# Patient Record
Sex: Female | Born: 1990 | Race: White | Hispanic: No | Marital: Single | Smoking: Never smoker
Health system: Southern US, Community
[De-identification: ages and names within clinical notes are randomized; demographics above are authoritative.]

## PROBLEM LIST (undated history)

## (undated) DIAGNOSIS — F32A Depression, unspecified: Secondary | ICD-10-CM

## (undated) DIAGNOSIS — T7840XA Allergy, unspecified, initial encounter: Secondary | ICD-10-CM

## (undated) DIAGNOSIS — F329 Major depressive disorder, single episode, unspecified: Secondary | ICD-10-CM

## (undated) HISTORY — PX: TONSILLECTOMY AND ADENOIDECTOMY: SUR1326

## (undated) HISTORY — DX: Major depressive disorder, single episode, unspecified: F32.9

## (undated) HISTORY — DX: Depression, unspecified: F32.A

## (undated) HISTORY — DX: Allergy, unspecified, initial encounter: T78.40XA

---
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
## 2013-03-03 ENCOUNTER — Emergency Department: Payer: Self-pay | Admitting: Emergency Medicine

## 2015-10-01 ENCOUNTER — Encounter: Payer: Self-pay | Admitting: Family Medicine

## 2016-01-09 DIAGNOSIS — F411 Generalized anxiety disorder: Secondary | ICD-10-CM | POA: Diagnosis not present

## 2016-01-09 DIAGNOSIS — F845 Asperger's syndrome: Secondary | ICD-10-CM | POA: Diagnosis not present

## 2016-01-09 DIAGNOSIS — F4321 Adjustment disorder with depressed mood: Secondary | ICD-10-CM | POA: Diagnosis not present

## 2016-02-06 DIAGNOSIS — F411 Generalized anxiety disorder: Secondary | ICD-10-CM | POA: Diagnosis not present

## 2016-02-06 DIAGNOSIS — F4321 Adjustment disorder with depressed mood: Secondary | ICD-10-CM | POA: Diagnosis not present

## 2016-02-06 DIAGNOSIS — F845 Asperger's syndrome: Secondary | ICD-10-CM | POA: Diagnosis not present

## 2016-03-11 DIAGNOSIS — D2272 Melanocytic nevi of left lower limb, including hip: Secondary | ICD-10-CM | POA: Diagnosis not present

## 2016-03-11 DIAGNOSIS — D225 Melanocytic nevi of trunk: Secondary | ICD-10-CM | POA: Diagnosis not present

## 2016-04-20 DIAGNOSIS — R05 Cough: Secondary | ICD-10-CM | POA: Diagnosis not present

## 2016-04-20 DIAGNOSIS — J069 Acute upper respiratory infection, unspecified: Secondary | ICD-10-CM | POA: Diagnosis not present

## 2016-05-01 DIAGNOSIS — F411 Generalized anxiety disorder: Secondary | ICD-10-CM | POA: Diagnosis not present

## 2016-05-01 DIAGNOSIS — F4321 Adjustment disorder with depressed mood: Secondary | ICD-10-CM | POA: Diagnosis not present

## 2016-05-01 DIAGNOSIS — F845 Asperger's syndrome: Secondary | ICD-10-CM | POA: Diagnosis not present

## 2016-07-02 DIAGNOSIS — H52223 Regular astigmatism, bilateral: Secondary | ICD-10-CM | POA: Diagnosis not present

## 2016-07-02 DIAGNOSIS — H5203 Hypermetropia, bilateral: Secondary | ICD-10-CM | POA: Diagnosis not present

## 2017-11-15 ENCOUNTER — Ambulatory Visit: Payer: BLUE CROSS/BLUE SHIELD | Admitting: Family Medicine

## 2017-11-15 ENCOUNTER — Telehealth: Payer: Self-pay | Admitting: Family Medicine

## 2017-11-15 ENCOUNTER — Encounter: Payer: Self-pay | Admitting: Family Medicine

## 2017-11-15 VITALS — BP 120/70 | HR 100 | Temp 98.4°F | Resp 16 | Ht 61.0 in | Wt 141.5 lb

## 2017-11-15 DIAGNOSIS — B079 Viral wart, unspecified: Secondary | ICD-10-CM

## 2017-11-15 DIAGNOSIS — Z13220 Encounter for screening for lipoid disorders: Secondary | ICD-10-CM | POA: Diagnosis not present

## 2017-11-15 DIAGNOSIS — J302 Other seasonal allergic rhinitis: Secondary | ICD-10-CM | POA: Diagnosis not present

## 2017-11-15 DIAGNOSIS — F840 Autistic disorder: Secondary | ICD-10-CM

## 2017-11-15 DIAGNOSIS — Z114 Encounter for screening for human immunodeficiency virus [HIV]: Secondary | ICD-10-CM | POA: Diagnosis not present

## 2017-11-15 DIAGNOSIS — Z131 Encounter for screening for diabetes mellitus: Secondary | ICD-10-CM | POA: Diagnosis not present

## 2017-11-15 DIAGNOSIS — F411 Generalized anxiety disorder: Secondary | ICD-10-CM | POA: Insufficient documentation

## 2017-11-15 DIAGNOSIS — Z7689 Persons encountering health services in other specified circumstances: Secondary | ICD-10-CM | POA: Diagnosis not present

## 2017-11-15 DIAGNOSIS — Z0000 Encounter for general adult medical examination without abnormal findings: Secondary | ICD-10-CM | POA: Diagnosis not present

## 2017-11-15 DIAGNOSIS — Z833 Family history of diabetes mellitus: Secondary | ICD-10-CM

## 2017-11-15 DIAGNOSIS — F339 Major depressive disorder, recurrent, unspecified: Secondary | ICD-10-CM | POA: Diagnosis not present

## 2017-11-15 DIAGNOSIS — F4321 Adjustment disorder with depressed mood: Secondary | ICD-10-CM

## 2017-11-15 DIAGNOSIS — E663 Overweight: Secondary | ICD-10-CM | POA: Diagnosis not present

## 2017-11-15 DIAGNOSIS — Z8639 Personal history of other endocrine, nutritional and metabolic disease: Secondary | ICD-10-CM

## 2017-11-15 DIAGNOSIS — Z23 Encounter for immunization: Secondary | ICD-10-CM | POA: Diagnosis not present

## 2017-11-15 DIAGNOSIS — T7840XA Allergy, unspecified, initial encounter: Secondary | ICD-10-CM | POA: Insufficient documentation

## 2017-11-15 DIAGNOSIS — F845 Asperger's syndrome: Secondary | ICD-10-CM | POA: Insufficient documentation

## 2017-11-15 NOTE — Telephone Encounter (Signed)
Documentation reviewed, diagnoses updated in patient's problem list to reflect Dr. Waylan Boga notes from June 2018.

## 2017-11-15 NOTE — Telephone Encounter (Signed)
Please request records from Dr. Nicolasa Ducking regarding depression and autism diagnoses. Thanks!

## 2017-11-15 NOTE — Patient Instructions (Signed)
Preventive Care 18-39 Years, Female Preventive care refers to lifestyle choices and visits with your health care provider that can promote health and wellness. What does preventive care include?  A yearly physical exam. This is also called an annual well check.  Dental exams once or twice a year.  Routine eye exams. Ask your health care provider how often you should have your eyes checked.  Personal lifestyle choices, including: ? Daily care of your teeth and gums. ? Regular physical activity. ? Eating a healthy diet. ? Avoiding tobacco and drug use. ? Limiting alcohol use. ? Practicing safe sex. ? Taking vitamin and mineral supplements as recommended by your health care provider. What happens during an annual well check? The services and screenings done by your health care provider during your annual well check will depend on your age, overall health, lifestyle risk factors, and family history of disease. Counseling Your health care provider may ask you questions about your:  Alcohol use.  Tobacco use.  Drug use.  Emotional well-being.  Home and relationship well-being.  Sexual activity.  Eating habits.  Work and work Statistician.  Method of birth control.  Menstrual cycle.  Pregnancy history.  Screening You may have the following tests or measurements:  Height, weight, and BMI.  Diabetes screening. This is done by checking your blood sugar (glucose) after you have not eaten for a while (fasting).  Blood pressure.  Lipid and cholesterol levels. These may be checked every 5 years starting at age 66.  Skin check.  Hepatitis C blood test.  Hepatitis B blood test.  Sexually transmitted disease (STD) testing.  BRCA-related cancer screening. This may be done if you have a family history of breast, ovarian, tubal, or peritoneal cancers.  Pelvic exam and Pap test. This may be done every 3 years starting at age 40. Starting at age 59, this may be done every 5  years if you have a Pap test in combination with an HPV test.  Discuss your test results, treatment options, and if necessary, the need for more tests with your health care provider. Vaccines Your health care provider may recommend certain vaccines, such as:  Influenza vaccine. This is recommended every year.  Tetanus, diphtheria, and acellular pertussis (Tdap, Td) vaccine. You may need a Td booster every 10 years.  Varicella vaccine. You may need this if you have not been vaccinated.  HPV vaccine. If you are 69 or younger, you may need three doses over 6 months.  Measles, mumps, and rubella (MMR) vaccine. You may need at least one dose of MMR. You may also need a second dose.  Pneumococcal 13-valent conjugate (PCV13) vaccine. You may need this if you have certain conditions and were not previously vaccinated.  Pneumococcal polysaccharide (PPSV23) vaccine. You may need one or two doses if you smoke cigarettes or if you have certain conditions.  Meningococcal vaccine. One dose is recommended if you are age 27-21 years and a first-year college student living in a residence hall, or if you have one of several medical conditions. You may also need additional booster doses.  Hepatitis A vaccine. You may need this if you have certain conditions or if you travel or work in places where you may be exposed to hepatitis A.  Hepatitis B vaccine. You may need this if you have certain conditions or if you travel or work in places where you may be exposed to hepatitis B.  Haemophilus influenzae type b (Hib) vaccine. You may need this if  you have certain risk factors.  Talk to your health care provider about which screenings and vaccines you need and how often you need them. This information is not intended to replace advice given to you by your health care provider. Make sure you discuss any questions you have with your health care provider. Document Released: 11/24/2001 Document Revised: 06/17/2016  Document Reviewed: 07/30/2015 Elsevier Interactive Patient Education  [employer].

## 2017-11-15 NOTE — Telephone Encounter (Signed)
Records under Media tab

## 2017-11-15 NOTE — Progress Notes (Signed)
Name: Stacy Joseph   MRN: [id number]    DOB: [birth year]   Date:11/15/2017       Progress Note  Subjective  Chief Complaint  Chief Complaint  Patient presents with  . Establish Care    HPI  Patient presents for annual CPE and to establish care with our clinic.  She has been living with her sister and future brother-in-law in [place] and caring for their two young children; walks their dog daily, eating healthier because she is eating mostly healthy home-cooked meals. She is back and forth between [place] and [place].  Autism & Depression: Sees Dr. Nicolasa Ducking who prescribes her Fluoxetine; has been doing well on this.  She was diagnosed at age 74yo with autism spectrum disorder; also managed by Dr. Nicolasa Ducking.  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  Doing well on Prozac. Depression screen [facility] 2/9 11/15/2017  Decreased Interest 0  Down, Depressed, Hopeless 0  PHQ - 2 Score 0   Hypertension: at goal BP Readings from Last 3 Encounters:  11/15/17 120/70   Obesity:  Wt Readings from Last 3 Encounters:  11/15/17 141 lb 8 oz (64.2 kg)   BMI Readings from Last 3 Encounters:  11/15/17 26.74 kg/m    Alcohol: Does not drink alcohol - has tried sips in the past but it tastes terrible to her. Tobacco use: Never user HIV, hep B, hep C: Declines - has never been sexually active; willing to have HIV testing for Health Maintenance update. STD testing and prevention (chl/gon/syphilis): Declines - has never been sexually active Intimate partner violence: No concerns today Sexual History/Pain during Intercourse: Declines - has never been sexually active Menstrual History/LMP/Abnormal Bleeding: Menses is usually slightly irregular, but usually every 25-30 - not on any form of birth control as she is not/has never been sexually active.   Incontinence Symptoms: Denies Sexual  Orientation: Feels that she is more asexual at this time.  Advanced Care Planning: A voluntary discussion about advance care planning including the explanation and discussion of advance directives.  Discussed health care proxy and Living will, and the patient was able to identify a health care proxy as Stacy Joseph (Mother).  Patient does not have a living will at present time. If patient does have living will, I have requested they bring this to the clinic to be scanned in to their chart.  Breast cancer: Does not qualify; no family or personal history. No results found for: HMMAMMO  BRCA gene screening: Does not qualify. Cervical cancer screening: She has had one attempted pap in the past, she has never been sexually active, so this experience was very painful for her.  She does not want to have a pap today - discussed risk/benefit of pap, and she declines.  Osteoporosis: Does not qualify for screening. No results found for: HMDEXASCAN  Fall prevention/vitamin D: Has had low vitamin D in the past, takes supplement; we will check today. Lipids: We will check today. No results found for: CHOL No results found for: HDL No results found for: LDLCALC No results found for: TRIG No results found for: CHOLHDL No results found for: LDLDIRECT  Glucose: We will check today.  No results found for: GLUCOSE, GLUCAP  Skin cancer: Does have two warts on palm of right hand; otherwise no concerning moles or lesions.  No family history of skin cancer.  Has seen dermatology in the past for skin survey, does not want to return today. Colorectal cancer: Paternal Grandfather had colorectal cancer; otherwise no family hx.  No changes in BM's, no blood in stool, dark and tarry stools, constipation/diarrhea.   Patient Active Problem List   Diagnosis Date Noted  . Seasonal allergic rhinitis 11/15/2017  . Autism spectrum disorder 11/15/2017  . Depression   . Allergy     Past Surgical History:    Procedure Laterality Date  . TONSILLECTOMY AND ADENOIDECTOMY      Family History  Problem Relation Age of Onset  . Allergic rhinitis Mother   . Diabetes Mother   . Renal Disease Mother   . Allergic rhinitis Father   . Depression Father        Paternal side of family has strong history depression  . ADD / ADHD Sister   . Colon cancer Paternal Grandfather     Social History   Socioeconomic History  . Marital status: Single    Spouse name: Not on file  . Number of children: Not on file  . Years of education: Not on file  . Highest education level: Not on file  Social Needs  . Financial resource strain: Not hard at all  . Food insecurity - worry: Never true  . Food insecurity - inability: Never true  . Transportation needs - medical: No  . Transportation needs - non-medical: No  Occupational History  . 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 Smoking status: Never Smoker  . Smokeless tobacco: Never Used  Substance and Sexual Activity  . Alcohol use: No    Frequency: Never  . Drug use: No  . Sexual activity: Not Currently    Comment: Never been sexually active  Other Topics Concern  . Not on file  Social History Narrative   She lives with her sister in [place] most of the time to nanny her two young children; otherwise lives with her mother.     Current Outpatient Medications:  .  Cholecalciferol (VITAMIN D3) 1000 units CAPS, Take by mouth., Disp: , Rfl:  .  FLUoxetine (PROZAC) 10 MG tablet, Take 20 mg by mouth. , Disp: , Rfl:  .  loratadine-pseudoephedrine (CLARITIN-D 24 HOUR) 10-240 MG 24 hr tablet, Take by mouth., Disp: , Rfl:  .  Multiple Vitamins-Minerals (WOMENS DAILY FORM/FA/CA/FE) TABS, Take by mouth., Disp: , Rfl:   No Known Allergies   ROS  Constitutional: Negative for fever or weight change.  Respiratory: Negative for cough and shortness of breath.   Cardiovascular: Negative for chest pain or palpitations.   Gastrointestinal: Negative for abdominal pain, no bowel changes.  Musculoskeletal: Negative for gait problem or joint swelling.  Skin: Negative for rash. She notes two flesh colored lesions to palm of RIGHT hand. Neurological: Negative for dizziness or headache.  No other specific complaints in a complete review of systems (except as listed in HPI above).   Objective  Vitals:   11/15/17 0959  BP: 120/70  Pulse: 100  Resp: 16  Temp: 98.4 F (36.9 C)  TempSrc: Oral  SpO2: 97%  Weight: 141 lb 8 oz (64.2 kg)  Height: 5' 1"  (1.549 m)    Body mass index is 26.74 kg/m.  Physical Exam  Skin:      Constitutional: Patient appears well-developed and well-nourished. No distress.  HENT: Head: Normocephalic and atraumatic. Ears: B TMs ok, no erythema or effusion; Nose: Nose normal. Mouth/Throat:  Oropharynx is clear and moist. No oropharyngeal exudate.  Eyes: Conjunctivae and EOM are normal. Pupils are equal, round, and reactive to light. No scleral icterus.  Neck: Normal range of motion. Neck supple. No JVD present. No thyromegaly present.  Cardiovascular: Normal rate, regular rhythm and normal heart sounds.  No murmur heard. No BLE edema. Pulmonary/Chest: Effort normal and breath sounds normal. No respiratory distress. Abdominal: Soft. Bowel sounds are normal, no distension. There is no tenderness. no masses Breast: no lumps or masses, no nipple discharge or rashes FEMALE GENITALIA: PT refused Musculoskeletal: Normal range of motion, no joint effusions. No gross deformities Neurological: she is alert and oriented to person, place, and time. No cranial nerve deficit. Coordination, balance, strength, speech and gait are normal.  Skin: Skin is warm and dry. No rash noted. No erythema. Two very small (<.5cm) flesh colored warty lesions to RIGHT palm, non-tender, no bleeding or exudate. Psychiatric: Patient has a normal mood and affect. behavior is normal. Judgment and thought content  normal.  No results found for this or any previous visit (from the past 2160 hour(s)).   PHQ2/9: Depression screen PHQ 2/9 11/15/2017  Decreased Interest 0  Down, Depressed, Hopeless 0  PHQ - 2 Score 0   Fall Risk: Fall Risk  11/15/2017  Falls in the past year? No   Assessment & Plan  1. Well woman exam (no gynecological exam) - Stable -USPSTF grade A and B recommendations reviewed with patient; age-appropriate recommendations, preventive care, screening tests, etc discussed and encouraged; healthy living encouraged; see AVS for patient education given to patient -Discussed importance of 150 minutes of physical activity weekly, eat two servings of fish weekly, eat one serving of tree nuts ( cashews, pistachios, pecans, almonds.Marland Kitchen) every other day, eat 6 servings of fruit/vegetables daily and drink plenty of water and avoid sweet beverages.   2. Episode of recurrent major depressive disorder, unspecified depression episode severity ([place]) - Keep follow up with Dr. Nicolasa Ducking, we will request records  3. Seasonal allergic rhinitis, unspecified trigger - Continue OTC Claritin PRN  4. Autism spectrum disorder - Keep follow up with Dr. Nicolasa Ducking, we will request records  5. Screening for hyperlipidemia - Lipid panel  6. H/O vitamin D deficiency - Vitamin D (25 hydroxy) - Continue OTC supplementation  7. Family history of diabetes mellitus - COMPLETE METABOLIC PANEL WITH GFR  8. Screening for diabetes mellitus - COMPLETE METABOLIC PANEL WITH GFR  9. Overweight (BMI 25.0-29.9) - Lifestyle modifications discussed, continue healthy eating habits and exercise regularly. - COMPLETE METABOLIC PANEL WITH GFR - Lipid panel  10. Encounter to establish care - Discussed clinic in detail with patient.  11. Needs flu shot - Flu vaccine > 3yo with preservative IM (Fluvirin Influenza Split)  12. Need for Tdap vaccination - Tdap vaccine greater than or equal to 7yo IM  13. Encounter for screening  for HIV - HIV antibody  14. Viral warts, unspecified type - She would like to try OTC topical treatment first; advised that she should return in 3-4 weeks if not improving and we will refer to dermatology.  -Reviewed Health Maintenance: Pt refuses pap, states has never been sexually active and had painful experience in the past with an attempted pap. Discussed risk/benefit of pap w/ HPV testing and she declines at today's visit.

## 2017-11-17 LAB — LIPID PANEL
CHOLESTEROL: 146 mg/dL (ref <200)
HDL: 35 mg/dL — ABNORMAL LOW (ref >50)
LDL CHOLESTEROL (CALC): 86 mg/dL
Non-HDL Cholesterol (Calc): 111 mg/dL (calc) (ref <130)
Total CHOL/HDL Ratio: 4.2 (calc) (ref <5.0)
Triglycerides: 152 mg/dL — ABNORMAL HIGH (ref <150)

## 2017-11-17 LAB — COMPLETE METABOLIC PANEL WITH GFR
AG Ratio: 1.7 (calc) (ref 1.0–2.5)
ALBUMIN MSPROF: 4.5 g/dL (ref 3.6–5.1)
ALKALINE PHOSPHATASE (APISO): 69 U/L (ref 33–115)
ALT: 8 U/L (ref 6–29)
AST: 14 U/L (ref 10–30)
BILIRUBIN TOTAL: 0.3 mg/dL (ref 0.2–1.2)
BUN: 16 mg/dL (ref 7–25)
CHLORIDE: 105 mmol/L (ref 98–110)
CO2: 27 mmol/L (ref 20–32)
Calcium: 9.4 mg/dL (ref 8.6–10.2)
Creat: 0.75 mg/dL (ref 0.50–1.10)
GFR, Est African American: 127 mL/min/{1.73_m2} (ref >=60)
GFR, Est Non African American: 109 mL/min/{1.73_m2} (ref >=60)
GLUCOSE: 96 mg/dL (ref 65–99)
Globulin: 2.7 g/dL (calc) (ref 1.9–3.7)
Potassium: 4.3 mmol/L (ref 3.5–5.3)
Sodium: 138 mmol/L (ref 135–146)
Total Protein: 7.2 g/dL (ref 6.1–8.1)

## 2017-11-17 LAB — VITAMIN D 25 HYDROXY (VIT D DEFICIENCY, FRACTURES): VIT D 25 HYDROXY: 33 ng/mL (ref 30–100)

## 2017-11-17 LAB — HIV ANTIBODY (ROUTINE TESTING W REFLEX): HIV 1&2 Ab, 4th Generation: NONREACTIVE

## 2018-05-19 ENCOUNTER — Encounter: Payer: BLUE CROSS/BLUE SHIELD | Admitting: Family Medicine

## 2019-06-06 ENCOUNTER — Encounter: Payer: Self-pay | Admitting: Family Medicine

## 2019-07-18 ENCOUNTER — Other Ambulatory Visit: Payer: Self-pay

## 2019-07-18 DIAGNOSIS — Z20822 Contact with and (suspected) exposure to covid-19: Secondary | ICD-10-CM

## 2019-07-18 DIAGNOSIS — Z20828 Contact with and (suspected) exposure to other viral communicable diseases: Secondary | ICD-10-CM

## 2019-07-20 LAB — NOVEL CORONAVIRUS, NAA: SARS-CoV-2, NAA: NOT DETECTED

## 2019-10-27 ENCOUNTER — Ambulatory Visit: Payer: 59 | Attending: Internal Medicine

## 2019-10-27 DIAGNOSIS — Z20822 Contact with and (suspected) exposure to covid-19: Secondary | ICD-10-CM

## 2019-10-28 LAB — NOVEL CORONAVIRUS, NAA: SARS-CoV-2, NAA: NOT DETECTED

## 2020-01-07 ENCOUNTER — Ambulatory Visit: Payer: 59 | Attending: Internal Medicine

## 2020-01-07 DIAGNOSIS — Z23 Encounter for immunization: Secondary | ICD-10-CM

## 2020-01-07 NOTE — Progress Notes (Signed)
   Covid-19 Vaccination Clinic  Name:  MEHER PAULLIN    MRN: [id number] DOB: 19-Mar-1991  01/07/2020  Ms. Kleeman was observed post Covid-19 immunization for 15 minutes without incident. She was provided with Vaccine Information Sheet and instruction to access the V-Safe system.   Ms. Apel was instructed to call 911 with any severe reactions post vaccine: Marland Kitchen Difficulty breathing  . Swelling of face and throat  . A fast heartbeat  . A bad rash all over body  . Dizziness and weakness   Immunizations Administered    Name Date Dose VIS Date Route   Pfizer COVID-19 Vaccine 01/07/2020  3:17 PM 0.3 mL 09/22/2019 Intramuscular   Manufacturer: [place]   Lot: [id number]   [place]: [id number]

## 2020-02-04 ENCOUNTER — Ambulatory Visit: Payer: 59 | Attending: Internal Medicine

## 2020-02-04 DIAGNOSIS — Z23 Encounter for immunization: Secondary | ICD-10-CM

## 2020-02-04 NOTE — Progress Notes (Signed)
   Covid-19 Vaccination Clinic  Name:  Stacy Joseph    MRN: [id number] DOB: 13-Mar-1991  02/04/2020  [name] was observed post Covid-19 immunization for 15 minutes without incident. She was provided with Vaccine Information Sheet and instruction to access the V-Safe system.   [name] was instructed to call 911 with any severe reactions post vaccine: Marland Kitchen Difficulty breathing  . Swelling of face and throat  . A fast heartbeat  . A bad rash all over body  . Dizziness and weakness   Immunizations Administered    Name Date Dose VIS Date Route   Pfizer COVID-19 Vaccine 02/04/2020  3:10 PM 0.3 mL 12/06/2018 Intramuscular   Manufacturer: [employer], [employer]   Lot: [id number]   [place]: [id number]

## 2020-11-07 ENCOUNTER — Encounter: Payer: Self-pay | Admitting: Certified Nurse Midwife

## 2021-09-01 ENCOUNTER — Encounter: Payer: Self-pay | Admitting: Nurse Practitioner

## 2021-09-01 ENCOUNTER — Ambulatory Visit (INDEPENDENT_AMBULATORY_CARE_PROVIDER_SITE_OTHER): Payer: 59 | Admitting: Nurse Practitioner

## 2021-09-01 ENCOUNTER — Other Ambulatory Visit: Payer: Self-pay

## 2021-09-01 VITALS — BP 118/72 | HR 98 | Temp 98.3°F | Resp 16 | Ht 61.0 in | Wt 171.6 lb

## 2021-09-01 DIAGNOSIS — Z7689 Persons encountering health services in other specified circumstances: Secondary | ICD-10-CM | POA: Diagnosis not present

## 2021-09-01 DIAGNOSIS — F411 Generalized anxiety disorder: Secondary | ICD-10-CM | POA: Diagnosis not present

## 2021-09-01 DIAGNOSIS — F331 Major depressive disorder, recurrent, moderate: Secondary | ICD-10-CM

## 2021-09-01 MED ORDER — FLUOXETINE HCL 20 MG PO TABS
20.0000 mg | ORAL_TABLET | Freq: Every day | ORAL | 0 refills | Status: DC
  Filled 2021-09-01: qty 30, 30d supply, fill #0

## 2021-09-01 NOTE — Progress Notes (Signed)
BP 118/72   Pulse 98   Temp 98.3 F (36.8 C) (Oral)   Resp 16   Ht 5\' 1"  (1.549 m)   Wt 171 lb 9.6 oz (77.8 kg)   LMP 09/01/2021   SpO2 98%   BMI 32.42 kg/m    Subjective:    Patient ID: Stacy Joseph, female    DOB: [birth year], 30 y.o.   MRN: [id number]  HPI: Stacy Joseph is a 30 y.o. female, here alone  Chief Complaint  Patient presents with   Establish Care   Establish care:  She has not had a physical since 11/2017.  She used to be a patient at this office with Raelyn Ensign NP.    Depression/ anxiety: She used to take prozac 20 mg but she has been out of it for awhile.  She says she has a high stress job at [employer].  She denies any suicidal thoughts.  PHQ9 score is 12 and her GAD score is 10.  Will restart the Prozac and she will follow-up in a month to see how she is doing. She is not currently interested in counseling.  Depression screen [facility] 2/9 09/01/2021 11/15/2017  Decreased Interest 2 0  Down, Depressed, Hopeless 2 0  PHQ - 2 Score 4 0  Altered sleeping 0 -  Tired, decreased energy 3 -  Change in appetite 3 -  Feeling bad or failure about yourself  1 -  Trouble concentrating 1 -  Moving slowly or fidgety/restless 0 -  Suicidal thoughts 0 -  PHQ-9 Score 12 -  Difficult doing work/chores Somewhat difficult -    GAD 7 : Generalized Anxiety Score 09/01/2021  Nervous, Anxious, on Edge 3  Control/stop worrying 2  Worry too much - different things 2  Trouble relaxing 0  Restless 0  Easily annoyed or irritable 2  Afraid - awful might happen 1  Total GAD 7 Score 10  Anxiety Difficulty Somewhat difficult     Relevant past medical, surgical, family and social history reviewed and updated as indicated. Interim medical history since our last visit reviewed. Allergies and medications reviewed and updated.  Review of Systems  Constitutional: Negative for fever or weight change.  Respiratory: Negative for cough and shortness of breath.    Cardiovascular: Negative for chest pain or palpitations.  Gastrointestinal: Negative for abdominal pain, no bowel changes.  Musculoskeletal: Negative for gait problem or joint swelling.  Skin: Negative for rash.  Neurological: Negative for dizziness or headache.  No other specific complaints in a complete review of systems (except as listed in HPI above).      Objective:    BP 118/72   Pulse 98   Temp 98.3 F (36.8 C) (Oral)   Resp 16   Ht 5\' 1"  (1.549 m)   Wt 171 lb 9.6 oz (77.8 kg)   LMP 09/01/2021   SpO2 98%   BMI 32.42 kg/m   Wt Readings from Last 3 Encounters:  09/01/21 171 lb 9.6 oz (77.8 kg)  11/15/17 141 lb 8 oz (64.2 kg)    Physical Exam  Constitutional: Patient appears well-developed and well-nourished. No distress.  HEENT: head atraumatic, normocephalic, pupils equal and reactive to light, neck supple, throat within normal limits Cardiovascular: Normal rate, regular rhythm and normal heart sounds.  No murmur heard. No BLE edema. Pulmonary/Chest: Effort normal and breath sounds normal. No respiratory distress. Abdominal: Soft.  There is no tenderness. Psychiatric: Patient has a normal mood and affect. behavior is  normal. Judgment and thought content normal.   Results for orders placed or performed in visit on 10/27/19  Novel Coronavirus, NAA ([facility])   Specimen: Nasopharyngeal(NP) swabs in vial transport medium   NASOPHARYNGE  TESTING  Result Value Ref Range   SARS-CoV-2, NAA Not Detected Not Detected      Assessment & Plan:  1. Encounter to establish care -make appointment for physical  2. Moderate episode of recurrent major depressive disorder (HCC)  - FLUoxetine (PROZAC) 20 MG tablet; Take 1 tablet (20 mg total) by mouth daily.  Dispense: 30 tablet; Refill: 0  3. Generalized anxiety disorder  - FLUoxetine (PROZAC) 20 MG tablet; Take 1 tablet (20 mg total) by mouth daily.  Dispense: 30 tablet; Refill: 0   Follow up plan: Return in about 4 weeks  (around 09/29/2021) for follow up, and another appointment for a cpe.

## 2021-09-29 ENCOUNTER — Encounter: Payer: Self-pay | Admitting: Physician Assistant

## 2021-09-29 ENCOUNTER — Other Ambulatory Visit: Payer: Self-pay

## 2021-09-29 ENCOUNTER — Ambulatory Visit: Payer: 59 | Admitting: Physician Assistant

## 2021-09-29 VITALS — BP 110/64 | HR 99 | Temp 98.0°F | Resp 16 | Ht 61.0 in | Wt 170.3 lb

## 2021-09-29 DIAGNOSIS — F330 Major depressive disorder, recurrent, mild: Secondary | ICD-10-CM | POA: Diagnosis not present

## 2021-09-29 DIAGNOSIS — Z6832 Body mass index (BMI) 32.0-32.9, adult: Secondary | ICD-10-CM | POA: Diagnosis not present

## 2021-09-29 DIAGNOSIS — F331 Major depressive disorder, recurrent, moderate: Secondary | ICD-10-CM | POA: Diagnosis not present

## 2021-09-29 DIAGNOSIS — F411 Generalized anxiety disorder: Secondary | ICD-10-CM

## 2021-09-29 MED ORDER — FLUOXETINE HCL 20 MG PO TABS
20.0000 mg | ORAL_TABLET | Freq: Every day | ORAL | 6 refills | Status: DC
  Filled 2021-09-29: qty 30, 30d supply, fill #0
  Filled 2021-11-14: qty 30, 30d supply, fill #1
  Filled 2021-12-25: qty 30, 30d supply, fill #2
  Filled 2022-02-05: qty 30, 30d supply, fill #3
  Filled 2022-03-11: qty 30, 30d supply, fill #4
  Filled 2022-05-07: qty 30, 30d supply, fill #5

## 2021-09-29 NOTE — Assessment & Plan Note (Addendum)
Ongoing with BMI of 32 today. Patient is currently in physically demanding job and is planning to initiate exercise routine to daily schedule Plans to incorporate new, healthier options to diet as she is able with texture aversion related to Asperger's.  Follow up in 3 months.

## 2021-09-29 NOTE — Assessment & Plan Note (Signed)
Ongoing condition with intermittent exacerbations, currently responding well to recently initiated Fluoxetine 20mg  PO QD.  Discussed side effects to be mindful of and return precautions surrounding suicidal thoughts, hallucinations, changes to sexual desire, new onset mania.  Continue current medications Follow up in 3 months.

## 2021-09-29 NOTE — Assessment & Plan Note (Signed)
Chronic, ongoing and overall stabilized with Fluoxitine at this time.  Reports reduced anxiety and improved ability to handle stress at time of visit.  Discussed precautions for follow up surrounding any new symptoms comprised of suicidal thoughts, hallucinations, mania Follow up in 3 months.

## 2021-09-29 NOTE — Patient Instructions (Signed)
It was a pleasure meeting you today and being involved in your care. I have sent in a refill of your Fluoxetine and provided some information on anxiety, depressed mood and binge eating Please follow up in 3 months for a physical and lab work. We will follow up on your anxiety and depression at that time as well.

## 2021-09-29 NOTE — Progress Notes (Signed)
Established Patient Office Visit  Subjective:  Patient ID: Stacy Joseph, female    DOB: 05-07-1991  Age: 30 y.o. MRN: [id number]  CC:  Chief Complaint  Patient presents with   Follow-up   Anxiety    HPI Stacy Joseph presents for follow up for generalized anxiety and depressive disorders   Anxiety: Patient reports marked improvement in mood, energy levels. States she was able to handle a stressful situation over the last week without anxiety issues. Productivity at work has increased and irritability has been reduced  Potential hypomanic episode at work comprised of increased productivity, increased energy, more "bubbly" attitude with co-workers.- lasted one day while at work. Discussed importance of monitoring mood.  Denies changes to libido, increased or initiation of SI, hallucinations, palpitations, dizziness, nausea, vomiting, diarrhea.     Depression: Reports depressive episode is largely resolved. Reports improved ability to cope with stressful situations surrounding family.  Reports improvement in self image   States she is not currently in therapy due to schedule.    Sleep: States sleep is largely unchanged. Reports most sleep disturbance is from back pain not anxiety. Getting 7-8 hours and feels well-rested in AM  Diet: Reports a decrease in binges. Thinks she may have undiagnosed binge eating disorder but reports she is trying to incorporate healthier foods and trying to prevent binges as able. Denies purging activity.   Exercise: Currently putting 17000 steps per day at work.  Also does a bit of heavy lifting at job.    Past Medical History:  Diagnosis Date   Allergy    Depression     Past Surgical History:  Procedure Laterality Date   TONSILLECTOMY AND ADENOIDECTOMY      Family History  Problem Relation Age of Onset   Allergic rhinitis Mother    Diabetes Mother    Renal Disease Mother    Allergic rhinitis Father    Depression Father         Paternal side of family has strong history depression   ADD / ADHD Sister    Colon cancer Paternal Grandfather     Social History   Socioeconomic History   Marital status: Single    Spouse name: Not on file   Number of children: Not on file   Years of education: Not on file   Highest education level: Not on file  Occupational History   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   Social Determinants of Health   Financial Resource Strain: Not on file  Food Insecurity: Not on file  Transportation Needs: Not on file  Physical Activity: Not on file  Stress: Not on file  Social Connections: Not on file  Intimate Partner Violence: Not on file    Outpatient Medications Prior to Visit  Medication Sig Dispense Refill   diphenhydrAMINE (BENADRYL) 25 MG tablet Take 25 mg by mouth every 6 (six) hours as needed.     loratadine (CLARITIN) 10 MG  tablet Take 10 mg by mouth daily.     Multiple Vitamins-Minerals (WOMENS DAILY FORM/FA/CA/FE) TABS Take by mouth.     FLUoxetine (PROZAC) 20 MG tablet Take 1 tablet (20 mg total) by mouth daily. 30 tablet 0   No facility-administered medications prior to visit.    No Known Allergies  ROS Review of Systems  Constitutional:  Negative for fatigue and unexpected weight change.  Cardiovascular:  Negative for chest pain and palpitations.  Gastrointestinal:  Negative for constipation, diarrhea, nausea and vomiting.  Neurological:  Negative for dizziness, syncope, light-headedness and headaches.   Psychiatric/Behavioral:  Negative for agitation, behavioral problems, decreased concentration, dysphoric mood, hallucinations, sleep disturbance and suicidal ideas. The patient is not nervous/anxious.      Objective:    Physical Exam Constitutional:      General: She is awake.     Appearance: Normal appearance. She is well-developed, well-groomed and overweight.  HENT:     Head: Normocephalic and atraumatic.  Eyes:     General: Lids are normal.     Extraocular Movements: Extraocular movements intact.     Conjunctiva/sclera: Conjunctivae normal.     Pupils: Pupils are equal, round, and reactive to light.  Cardiovascular:     Rate and Rhythm: Normal rate and regular rhythm.     Pulses: Normal pulses.     Heart sounds: Normal heart sounds.  Pulmonary:     Effort: Pulmonary effort is normal.     Breath sounds: Normal breath sounds and air entry. No decreased breath sounds, wheezing, rhonchi or rales.  Musculoskeletal:     Right lower leg: No edema.     Left lower leg: No edema.  Neurological:     Mental Status: She is alert.  Psychiatric:        Attention and Perception: Attention normal.        Mood and Affect: Mood normal.        Speech: Speech normal.        Behavior: Behavior normal. Behavior is cooperative.        Thought Content: Thought content normal.        Cognition and Memory: Cognition normal.     Comments: Mildly inattentive and tangential but open to redirection and overall pleasant.     BP 110/64    Pulse 99    Temp 98 F (36.7 C) (Oral)    Resp 16    Ht 5\' 1"  (1.549 m)    Wt 170 lb 4.8 oz (77.2 kg)    LMP 09/01/2021    SpO2 99%    BMI 32.18 kg/m  Wt Readings from Last 3 Encounters:  09/29/21 170 lb 4.8 oz (77.2 kg)  09/01/21 171 lb 9.6 oz (77.8 kg)  11/15/17 141 lb 8 oz (64.2 kg)   PHQ9 SCORE ONLY 09/29/2021 09/01/2021 11/15/2017  PHQ-9 Total Score 2 12 0    GAD 7 : Generalized Anxiety Score 09/29/2021 09/01/2021  Nervous, Anxious, on Edge 0 3   Control/stop worrying 0 2  Worry too much - different things 0 2  Trouble relaxing 0 0  Restless 0 0  Easily annoyed or irritable 0 2  Afraid - awful might happen 0 1  Total GAD 7 Score 0 10  Anxiety Difficulty Not difficult at all Somewhat difficult      Health Maintenance Due  Topic Date Due   Hepatitis C Screening  Never done   PAP SMEAR-Modifier  Never done   COVID-19 Vaccine (3 - Booster for  Pfizer series) 03/31/2020    There are no preventive care reminders to display for this patient.  No results found for: TSH No results found for: WBC, HGB, HCT, MCV, PLT Lab Results  Component Value Date   NA 138 11/16/2017   K 4.3 11/16/2017   CO2 27 11/16/2017   GLUCOSE 96 11/16/2017   BUN 16 11/16/2017   CREATININE 0.75 11/16/2017   BILITOT 0.3 11/16/2017   AST 14 11/16/2017   ALT 8 11/16/2017   PROT 7.2 11/16/2017   CALCIUM 9.4 11/16/2017   Lab Results  Component Value Date   CHOL 146 11/16/2017   Lab Results  Component Value Date   HDL 35 (L) 11/16/2017   Lab Results  Component Value Date   LDLCALC 86 11/16/2017   Lab Results  Component Value Date   TRIG 152 (H) 11/16/2017   Lab Results  Component Value Date   CHOLHDL 4.2 11/16/2017   No results found for: HGBA1C    Assessment & Plan:    Problem List Items Addressed This Visit       Other   Generalized anxiety disorder - Primary    Chronic, ongoing and overall stabilized with Fluoxitine at this time.  Reports reduced anxiety and improved ability to handle stress at time of visit.  Discussed precautions for follow up surrounding any new symptoms comprised of suicidal thoughts, hallucinations, mania Follow up in 3 months.       Relevant Medications   FLUoxetine (PROZAC) 20 MG tablet   BMI 32.0-32.9,adult    Ongoing with BMI of 32 today. Patient is currently in physically demanding job and is planning to initiate exercise routine to daily schedule Plans to incorporate new, healthier options  to diet as she is able with texture aversion related to Asperger's.  Follow up in 3 months.       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  Discussed side effects to be mindful of and return precautions surrounding suicidal thoughts, hallucinations, changes to sexual desire, new onset mania.  Continue current medications Follow up in 3 months.       Relevant Medications   FLUoxetine (PROZAC) 20 MG tablet   Other Visit Diagnoses     Moderate episode of recurrent major depressive disorder (HCC)       Relevant Medications   FLUoxetine (PROZAC) 20 MG tablet        Return in about 3 months (around 12/28/2021) for Follow up for anxiety, depression and routine lab work, CPE.   I, [place], PA-C, have reviewed all documentation for this visit. The documentation on 09/29/21 for the exam, diagnosis, procedures, and orders are all accurate and complete.   Akiya Morr, Dani Gobble, PA-C MPH [facility]   Meds ordered this encounter  Medications   FLUoxetine (PROZAC) 20 MG tablet    Sig: Take 1 tablet (20 mg total) by mouth daily.    Dispense:  30 tablet    Refill:  6    Follow-up: Return in about 3 months (around 12/28/2021) for Follow up for anxiety, depression and routine lab work, CPE.    Valetta Mulroy E Robertine Kipper, PA-C

## 2021-11-03 ENCOUNTER — Encounter: Payer: Self-pay | Admitting: Nurse Practitioner

## 2021-11-03 ENCOUNTER — Other Ambulatory Visit: Payer: Self-pay

## 2021-11-03 ENCOUNTER — Ambulatory Visit (INDEPENDENT_AMBULATORY_CARE_PROVIDER_SITE_OTHER): Payer: 59 | Admitting: Nurse Practitioner

## 2021-11-03 VITALS — BP 120/70 | HR 100 | Temp 98.5°F | Resp 16 | Ht 61.0 in | Wt 170.9 lb

## 2021-11-03 DIAGNOSIS — Z0000 Encounter for general adult medical examination without abnormal findings: Secondary | ICD-10-CM | POA: Diagnosis not present

## 2021-11-03 NOTE — Progress Notes (Signed)
Name: Stacy Joseph   MRN: [id number]    DOB: December 25, 1990   Date:11/03/2021       Progress Note  Subjective  Chief Complaint  Chief Complaint  Patient presents with   Annual Exam    HPI  Patient presents for annual CPE.  Diet: fast food, working on breakfast and lunch eating more well balanced Exercise: physical job, works in [employer], discussed 150 min a week of physical activity  [place] from 11/03/2021 in [facility]  AUDIT-C Score 0      Depression: Phq 9 is  negative Depression screen [facility] 2/9 11/03/2021 09/29/2021 09/01/2021 11/15/2017  Decreased Interest 0 0 2 0  Down, Depressed, Hopeless 0 0 2 0  PHQ - 2 Score 0 0 4 0  Altered sleeping 0 0 0 -  Tired, decreased energy 0 0 3 -  Change in appetite 0 1 3 -  Feeling bad or failure about yourself  0 0 1 -  Trouble concentrating 0 0 1 -  Moving slowly or fidgety/restless 1 1 0 -  Suicidal thoughts 0 0 0 -  PHQ-9 Score 1 2 12  -  Difficult doing work/chores Not difficult at all Not difficult at all Somewhat difficult -   Hypertension: BP Readings from Last 3 Encounters:  11/03/21 120/70  09/29/21 110/64  09/01/21 118/72   Obesity: Wt Readings from Last 3 Encounters:  11/03/21 170 lb 14.4 oz (77.5 kg)  09/29/21 170 lb 4.8 oz (77.2 kg)  09/01/21 171 lb 9.6 oz (77.8 kg)   BMI Readings from Last 3 Encounters:  11/03/21 32.29 kg/m  09/29/21 32.18 kg/m  09/01/21 32.42 kg/m     Vaccines:  HPV: up to at age 18 , ask insurance if age between 11-45  Shingrix: 53-64 yo and ask insurance if covered when patient above 38 yo Pneumonia:  educated and discussed with patient. Flu:  educated and discussed with patient.  Hep C Screening: declined STD testing and prevention (HIV/chl/gon/syphilis): 11/16/2017 Intimate partner violence:none Sexual History :not sexually active Menstrual History/LMP/Abnormal Bleeding: [facility] 10/25/2021, normal to heavy flow Incontinence Symptoms:  none  Breast cancer:  - Last Mammogram: does not qualify, no concerns - BRCA gene screening: none  Osteoporosis: Discussed high calcium and vitamin D supplementation, weight bearing exercises  Cervical cancer screening: attempted and was unable to get speculum in for exam, patient is not sexually active  Skin cancer: Discussed monitoring for atypical lesions  Colorectal cancer: does not qualify, no concerns Lung cancer:   Low Dose CT Chest recommended if Age 45-80 years, 20 pack-year currently smoking OR have quit w/in 15years. Patient does not qualify.   ECG: none  Advanced Care Planning: A voluntary discussion about advance care planning including the explanation and discussion of advance directives.  Discussed health care proxy and Living will, and the patient was able to identify a health care proxy as mom.  Patient does not have a living will at present time. If patient does have living will, I have requested they bring this to the clinic to be scanned in to their chart.  Lipids: Lab Results  Component Value Date   CHOL 146 11/16/2017   Lab Results  Component Value Date   HDL 35 (L) 11/16/2017   Lab Results  Component Value Date   LDLCALC 86 11/16/2017   Lab Results  Component Value Date   TRIG 152 (H) 11/16/2017   Lab Results  Component Value Date   CHOLHDL 4.2  11/16/2017   No results found for: LDLDIRECT  Glucose: Glucose, Bld  Date Value Ref Range Status  11/16/2017 96 65 - 99 mg/dL Final    Comment:    .            Fasting reference interval .     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[place]) 09/29/2021   Seasonal allergic rhinitis 11/15/2017   Asperger's disorder 11/15/2017   Viral warts 11/15/2017   H/O vitamin D deficiency 11/15/2017   Family history of diabetes mellitus 11/15/2017   Generalized anxiety disorder    Allergy     Past Surgical History:   Procedure Laterality Date   TONSILLECTOMY AND ADENOIDECTOMY      Family History  Problem Relation Age of Onset   Allergic rhinitis Mother    Diabetes Mother    Renal Disease Mother    Allergic rhinitis Father    Depression Father        Paternal side of family has strong history depression   ADD / ADHD Sister    Colon cancer Paternal Grandfather     Social History   Socioeconomic History   Marital status: Single    Spouse name: Not on file   Number of children: Not on file   Years of education: Not on file   Highest education level: Not on file  Occupational History   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  Tobacco Use   Smoking status: Never   Smokeless tobacco: Never  Vaping Use   Vaping Use: Never used  Substance and Sexual Activity   Alcohol use: No   Drug use: No   Sexual activity: Not Currently    Comment: Never been sexually active  Other Topics Concern   Not on file  Social History Narrative   She lives works at [employer] Strain: Low Risk    Difficulty of Paying Living Expenses: Not hard at all  Food Insecurity: No Food Insecurity   Worried About Charity fundraiser in the Last Year: Never true   Arboriculturist in the Last Year: Never true  Transportation Needs: No Transportation Needs   Lack of Transportation (Medical): No   Lack of Transportation (Non-Medical): No  Physical Activity: Unknown   Days of Exercise per Week: 5 days   Minutes of Exercise per Session: Not on file  Stress: No Stress Concern Present   Feeling of Stress : Not at all  Social Connections: Socially Isolated   Frequency of Communication with Friends and Family: Once a week   Frequency of Social Gatherings with Friends and Family: More than three times a week   Attends Religious Services: Never   Marine scientist or Organizations: No   Attends Music therapist:  Never   Marital Status: Never married  Human resources officer Violence: Not At Risk   Fear of Current or Ex-Partner: No   Emotionally Abused: No   Physically Abused: No   Sexually Abused: No     Current Outpatient Medications:    diphenhydrAMINE (BENADRYL) 25 MG tablet, Take 25 mg by mouth every 6 (six) hours as needed., Disp: , Rfl:    FLUoxetine (PROZAC) 20 MG tablet, Take 1 tablet (20 mg total) by mouth daily., Disp: 30 tablet, Rfl: 6   loratadine (  CLARITIN) 10 MG tablet, Take 10 mg by mouth daily., Disp: , Rfl:    Multiple Vitamins-Minerals (WOMENS DAILY FORM/FA/CA/FE) TABS, Take by mouth., Disp: , Rfl:   No Known Allergies   ROS  Constitutional: Negative for fever or weight change.  Respiratory: Negative for cough and shortness of breath.   Cardiovascular: Negative for chest pain or palpitations.  Gastrointestinal: Negative for abdominal pain, no bowel changes.  Musculoskeletal: Negative for gait problem or joint swelling.  Skin: Negative for rash.  Neurological: Negative for dizziness or headache.  No other specific complaints in a complete review of systems (except as listed in HPI above).   Objective  Vitals:   11/03/21 0914  BP: 120/70  Pulse: 100  Resp: 16  Temp: 98.5 F (36.9 C)  TempSrc: Oral  SpO2: 98%  Weight: 170 lb 14.4 oz (77.5 kg)  Height: 5' 1"  (1.549 m)    Body mass index is 32.29 kg/m.  Physical Exam  Constitutional: Patient appears well-developed and well-nourished. No distress.  HENT: Head: Normocephalic and atraumatic. Ears: B TMs ok, no erythema or effusion; Nose: Nose normal. Mouth/Throat: Oropharynx is clear and moist. No oropharyngeal exudate.  Eyes: Conjunctivae and EOM are normal. Pupils are equal, round, and reactive to light. No scleral icterus.  Neck: Normal range of motion. Neck supple. No JVD present. No thyromegaly present.  Cardiovascular: Normal rate, regular rhythm and normal heart sounds.  No murmur heard. No BLE  edema. Pulmonary/Chest: Effort normal and breath sounds normal. No respiratory distress. Abdominal: Soft. Bowel sounds are normal, no distension. There is no tenderness. no masses Breast: no lumps or masses, no nipple discharge or rashes FEMALE GENITALIA:  External genitalia normal External urethra normal RECTAL: not done Musculoskeletal: Normal range of motion, no joint effusions. No gross deformities Neurological: he is alert and oriented to person, place, and time. No cranial nerve deficit. Coordination, balance, strength, speech and gait are normal.  Skin: Skin is warm and dry. No rash noted. No erythema.  Psychiatric: Patient has a normal mood and affect. behavior is normal. Judgment and thought content normal.   Fall Risk: Fall Risk  11/03/2021 09/29/2021 09/01/2021 11/15/2017  Falls in the past year? 0 0 0 No  Number falls in past yr: 0 0 0 -  Injury with Fall? 0 0 0 -  Risk for fall due to : - No Fall Risks - -  Follow up Falls evaluation completed Falls prevention discussed Falls evaluation completed -   Functional Status Survey: Is the patient deaf or have difficulty hearing?: No Does the patient have difficulty seeing, even when wearing glasses/contacts?: No Does the patient have difficulty concentrating, remembering, or making decisions?: No Does the patient have difficulty walking or climbing stairs?: No Does the patient have difficulty dressing or bathing?: No Does the patient have difficulty doing errands alone such as visiting a doctor's office or shopping?: No   Assessment & Plan  1. Annual physical exam -continue to increase physical activity -continue to work on eating more well balanced -unable to complete pap  -declined labs till next visit  -USPSTF grade A and B recommendations reviewed with patient; age-appropriate recommendations, preventive care, screening tests, etc discussed and encouraged; healthy living encouraged; see AVS for patient education given to  patient -Discussed importance of 150 minutes of physical activity weekly, eat two servings of fish weekly, eat one serving of tree nuts ( cashews, pistachios, pecans, almonds.Marland Kitchen) every other day, eat 6 servings of fruit/vegetables daily and drink plenty of  water and avoid sweet beverages.

## 2021-11-14 ENCOUNTER — Other Ambulatory Visit: Payer: Self-pay

## 2021-12-25 ENCOUNTER — Other Ambulatory Visit: Payer: Self-pay

## 2021-12-29 ENCOUNTER — Ambulatory Visit: Payer: 59 | Admitting: Nurse Practitioner

## 2021-12-29 ENCOUNTER — Encounter: Payer: Self-pay | Admitting: Nurse Practitioner

## 2021-12-29 ENCOUNTER — Other Ambulatory Visit: Payer: Self-pay

## 2021-12-29 VITALS — BP 118/78 | HR 98 | Temp 98.2°F | Resp 16 | Ht 61.0 in | Wt 175.1 lb

## 2021-12-29 DIAGNOSIS — F330 Major depressive disorder, recurrent, mild: Secondary | ICD-10-CM

## 2021-12-29 DIAGNOSIS — F411 Generalized anxiety disorder: Secondary | ICD-10-CM

## 2021-12-29 NOTE — Progress Notes (Signed)
? ?BP 118/78   Pulse 98   Temp 98.2 ?F (36.8 ?C)   Resp 16   Ht '5\' 1"'$  (1.549 m)   Wt 175 lb 1.6 oz (79.4 kg)   SpO2 99%   BMI 33.08 kg/m?   ? ?Subjective:  ? ? Patient ID: Stacy Joseph, female    DOB: Apr 08, 1991, 31 y.o.   MRN: [id number] ? ?HPI: ?Stacy Joseph is a 31 y.o. female, here alone ? ?Chief Complaint  ?Patient presents with  ? Follow-up  ? Depression  ? Anxiety  ? ?Anxiety/Depression: She says she is doing well on her medication. She is currently taking prozac 20 mg daily.  She says she is able to handle her stress and has even had motivation to do kind acts for others.  She says she has started bringing breakfast for her coworkers on Fridays.  She denies any suicidal thoughts.  Will continue with current treatment.  ? ?Depression screen [facility] 2/9 12/29/2021 11/03/2021 09/29/2021 09/01/2021 11/15/2017  ?Decreased Interest 0 0 0 2 0  ?Down, Depressed, Hopeless 0 0 0 2 0  ?PHQ - 2 Score 0 0 0 4 0  ?Altered sleeping 0 0 0 0 -  ?Tired, decreased energy 0 0 0 3 -  ?Change in appetite 1 0 1 3 -  ?Feeling bad or failure about yourself  0 0 0 1 -  ?Trouble concentrating 0 0 0 1 -  ?Moving slowly or fidgety/restless 0 1 1 0 -  ?Suicidal thoughts 0 0 0 0 -  ?PHQ-9 Score '1 1 2 12 '$ -  ?Difficult doing work/chores Not difficult at all Not difficult at all Not difficult at all Somewhat difficult -  ?  ?GAD 7 : Generalized Anxiety Score 12/29/2021 11/03/2021 09/29/2021 09/01/2021  ?Nervous, Anxious, on Edge 0 0 0 3  ?Control/stop worrying 0 0 0 2  ?Worry too much - different things 0 0 0 2  ?Trouble relaxing 0 0 0 0  ?Restless 0 0 0 0  ?Easily annoyed or irritable 0 0 0 2  ?Afraid - awful might happen 0 0 0 1  ?Total GAD 7 Score 0 0 0 10  ?Anxiety Difficulty Not difficult at all Not difficult at all Not difficult at all Somewhat difficult  ? ? ?Relevant past medical, surgical, family and social history reviewed and updated as indicated. Interim medical history since our last visit reviewed. ?Allergies and  medications reviewed and updated. ? ?Review of Systems ? ?Constitutional: Negative for fever or weight change.  ?Respiratory: Negative for cough and shortness of breath.   ?Cardiovascular: Negative for chest pain or palpitations.  ?Gastrointestinal: Negative for abdominal pain, no bowel changes.  ?Musculoskeletal: Negative for gait problem or joint swelling.  ?Skin: Negative for rash.  ?Neurological: Negative for dizziness or headache.  ?No other specific complaints in a complete review of systems (except as listed in HPI above).  ? ?   ?Objective:  ?  ?BP 118/78   Pulse 98   Temp 98.2 ?F (36.8 ?C)   Resp 16   Ht '5\' 1"'$  (1.549 m)   Wt 175 lb 1.6 oz (79.4 kg)   SpO2 99%   BMI 33.08 kg/m?   ?Wt Readings from Last 3 Encounters:  ?12/29/21 175 lb 1.6 oz (79.4 kg)  ?11/03/21 170 lb 14.4 oz (77.5 kg)  ?09/29/21 170 lb 4.8 oz (77.2 kg)  ?  ?Physical Exam ? ?Constitutional: Patient appears well-developed and well-nourished. Obese  No distress.  ?HEENT: head atraumatic,  normocephalic, pupils equal and reactive to light,  neck supple ?Cardiovascular: Normal rate, regular rhythm and normal heart sounds.  No murmur heard. No BLE edema. ?Pulmonary/Chest: Effort normal and breath sounds normal. No respiratory distress. ?Abdominal: Soft.  There is no tenderness. ?Psychiatric: Patient has a normal mood and affect. behavior is normal. Judgment and thought content normal.  ? ?Results for orders placed or performed in visit on 10/27/19  ?Novel Coronavirus, NAA ([facility])  ? Specimen: Nasopharyngeal(NP) swabs in vial transport medium  ? NASOPHARYNGE  TESTING  ?Result Value Ref Range  ? SARS-CoV-2, NAA Not Detected Not Detected  ? ?   ?Assessment & Plan:  ? ?1. Generalized anxiety disorder ?-continue current treatment ? ?2. Mild episode of recurrent depressive disorder ([place]) ?-continue current treatment ? ?Follow up plan: ?Return in about 6 months (around 07/01/2022) for follow up. ? ? ? ? ? ?

## 2022-02-05 ENCOUNTER — Other Ambulatory Visit: Payer: Self-pay

## 2022-03-11 ENCOUNTER — Other Ambulatory Visit: Payer: Self-pay

## 2022-05-05 ENCOUNTER — Other Ambulatory Visit: Payer: Self-pay

## 2022-05-07 ENCOUNTER — Other Ambulatory Visit: Payer: Self-pay

## 2022-06-25 NOTE — Progress Notes (Unsigned)
There were no vitals taken for this visit.   Subjective:    Patient ID: Stacy Joseph, female    DOB: Jun 16, 1991, 31 y.o.   MRN: [id number]  HPI: Stacy Joseph is a 31 y.o. female, here alone  No chief complaint on file.  Anxiety/Depression: She says she is doing well on her medication. She is currently taking prozac 20 mg daily.  She says she is able to handle her stress and has even had motivation to do kind acts for others.  She says she has started bringing breakfast for her coworkers on Fridays.  She denies any suicidal thoughts.  Will continue with current treatment.      12/29/2021    9:19 AM 11/03/2021    9:25 AM 09/29/2021   10:45 AM 09/01/2021    9:40 AM 11/15/2017    9:58 AM  Depression screen PHQ 2/9  Decreased Interest 0 0 0 2 0  Down, Depressed, Hopeless 0 0 0 2 0  PHQ - 2 Score 0 0 0 4 0  Altered sleeping 0 0 0 0   Tired, decreased energy 0 0 0 3   Change in appetite 1 0 1 3   Feeling bad or failure about yourself  0 0 0 1   Trouble concentrating 0 0 0 1   Moving slowly or fidgety/restless 0 1 1 0   Suicidal thoughts 0 0 0 0   PHQ-9 Score '1 1 2 12   '$ Difficult doing work/chores Not difficult at all Not difficult at all Not difficult at all Somewhat difficult        12/29/2021    9:19 AM 11/03/2021    9:25 AM 09/29/2021   10:48 AM 09/01/2021    9:41 AM  GAD 7 : Generalized Anxiety Score  Nervous, Anxious, on Edge 0 0 0 3  Control/stop worrying 0 0 0 2  Worry too much - different things 0 0 0 2  Trouble relaxing 0 0 0 0  Restless 0 0 0 0  Easily annoyed or irritable 0 0 0 2  Afraid - awful might happen 0 0 0 1  Total GAD 7 Score 0 0 0 10  Anxiety Difficulty Not difficult at all Not difficult at all Not difficult at all Somewhat difficult    Relevant past medical, surgical, family and social history reviewed and updated as indicated. Interim medical history since our last visit reviewed. Allergies and medications reviewed and updated.  Review of  Systems  Constitutional: Negative for fever or weight change.  Respiratory: Negative for cough and shortness of breath.   Cardiovascular: Negative for chest pain or palpitations.  Gastrointestinal: Negative for abdominal pain, no bowel changes.  Musculoskeletal: Negative for gait problem or joint swelling.  Skin: Negative for rash.  Neurological: Negative for dizziness or headache.  No other specific complaints in a complete review of systems (except as listed in HPI above).      Objective:    There were no vitals taken for this visit.  Wt Readings from Last 3 Encounters:  12/29/21 175 lb 1.6 oz (79.4 kg)  11/03/21 170 lb 14.4 oz (77.5 kg)  09/29/21 170 lb 4.8 oz (77.2 kg)    Physical Exam  Constitutional: Patient appears well-developed and well-nourished. Obese  No distress.  HEENT: head atraumatic, normocephalic, pupils equal and reactive to light,  neck supple Cardiovascular: Normal rate, regular rhythm and normal heart sounds.  No murmur heard. No BLE edema. Pulmonary/Chest: Effort normal and  breath sounds normal. No respiratory distress. Abdominal: Soft.  There is no tenderness. Psychiatric: Patient has a normal mood and affect. behavior is normal. Judgment and thought content normal.   Results for orders placed or performed in visit on 10/27/19  Novel Coronavirus, NAA ([facility])   Specimen: Nasopharyngeal(NP) swabs in vial transport medium   NASOPHARYNGE  TESTING  Result Value Ref Range   SARS-CoV-2, NAA Not Detected Not Detected      Assessment & Plan:   1. Generalized anxiety disorder -continue current treatment  2. Mild episode of recurrent depressive disorder ([place]) -continue current treatment  Follow up plan: No follow-ups on file.

## 2022-06-26 ENCOUNTER — Other Ambulatory Visit: Payer: Self-pay

## 2022-06-26 ENCOUNTER — Ambulatory Visit: Payer: 59 | Admitting: Nurse Practitioner

## 2022-06-26 ENCOUNTER — Encounter: Payer: Self-pay | Admitting: Nurse Practitioner

## 2022-06-26 VITALS — BP 110/68 | HR 100 | Temp 98.3°F | Resp 18 | Ht 61.0 in | Wt 185.1 lb

## 2022-06-26 DIAGNOSIS — F411 Generalized anxiety disorder: Secondary | ICD-10-CM

## 2022-06-26 DIAGNOSIS — F330 Major depressive disorder, recurrent, mild: Secondary | ICD-10-CM

## 2022-06-26 MED ORDER — FLUOXETINE HCL 40 MG PO CAPS
40.0000 mg | ORAL_CAPSULE | Freq: Every day | ORAL | 0 refills | Status: DC
  Filled 2022-06-26: qty 30, 30d supply, fill #0

## 2022-06-26 NOTE — Assessment & Plan Note (Signed)
Patient reports that her mental health has been worse since last visit.  Says she has had a lot of stressors at work.  Increase Prozac to 40 mg daily.  Follow-up in 4 weeks.

## 2022-07-30 NOTE — Progress Notes (Signed)
BP 120/78   Pulse 91   Temp 97.9 F (36.6 C) (Oral)   Resp 16   Ht '5\' 1"'$  (1.549 m)   Wt 186 lb 1.6 oz (84.4 kg)   SpO2 98%   BMI 35.16 kg/m    Subjective:    Patient ID: Stacy Joseph, female    DOB: 1991/03/11, 31 y.o.   MRN: [id number]  HPI: Stacy Joseph is a 31 y.o. female, here alone  Chief Complaint  Patient presents with   Depression    4 week follow up   Anxiety/Depression: Last visit we increased patient's Prozac to 40 mg daily.  She is here for follow-up. PHQ9 and GAD scores  have improved. Patient reports she is doing well on the medication.  She says she still has stress at work but is doing better.   She says that at work they are short staffed and she is having to do a lot of mandatory overtime.  She says that other things that help her mood and stress level is playing with her parents dog.     07/31/2022   10:20 AM 06/26/2022    9:15 AM 12/29/2021    9:19 AM 11/03/2021    9:25 AM 09/29/2021   10:45 AM  Depression screen PHQ 2/9  Decreased Interest 0 0 0 0 0  Down, Depressed, Hopeless 0 0 0 0 0  PHQ - 2 Score 0 0 0 0 0  Altered sleeping 0 1 0 0 0  Tired, decreased energy 1 1 0 0 0  Change in appetite '1 1 1 '$ 0 1  Feeling bad or failure about yourself  0 0 0 0 0  Trouble concentrating 0 0 0 0 0  Moving slowly or fidgety/restless 0 0 0 1 1  Suicidal thoughts 0 0 0 0 0  PHQ-9 Score '2 3 1 1 2  '$ Difficult doing work/chores Not difficult at all Somewhat difficult Not difficult at all Not difficult at all Not difficult at all       07/31/2022   10:20 AM 06/26/2022    9:15 AM 12/29/2021    9:19 AM 11/03/2021    9:25 AM  GAD 7 : Generalized Anxiety Score  Nervous, Anxious, on Edge 0 1 0 0  Control/stop worrying 0 1 0 0  Worry too much - different things 0 1 0 0  Trouble relaxing 0 0 0 0  Restless 0 0 0 0  Easily annoyed or irritable 1 1 0 0  Afraid - awful might happen 0 0 0 0  Total GAD 7 Score 1 4 0 0  Anxiety Difficulty Not difficult at all  Somewhat difficult Not difficult at all Not difficult at all    Relevant past medical, surgical, family and social history reviewed and updated as indicated. Interim medical history since our last visit reviewed. Allergies and medications reviewed and updated.  Review of Systems  Constitutional: Negative for fever or weight change.  Respiratory: Negative for cough and shortness of breath.   Cardiovascular: Negative for chest pain or palpitations.  Gastrointestinal: Negative for abdominal pain, no bowel changes.  Musculoskeletal: Negative for gait problem or joint swelling.  Skin: Negative for rash.  Neurological: Negative for dizziness or headache.  No other specific complaints in a complete review of systems (except as listed in HPI above).      Objective:    BP 120/78   Pulse 91   Temp 97.9 F (36.6 C) (Oral)   Resp  16   Ht '5\' 1"'$  (1.549 m)   Wt 186 lb 1.6 oz (84.4 kg)   SpO2 98%   BMI 35.16 kg/m   Wt Readings from Last 3 Encounters:  07/31/22 186 lb 1.6 oz (84.4 kg)  06/26/22 185 lb 1.6 oz (84 kg)  12/29/21 175 lb 1.6 oz (79.4 kg)    Physical Exam  Constitutional: Patient appears well-developed and well-nourished. Obese  No distress.  HEENT: head atraumatic, normocephalic, pupils equal and reactive to light,  neck supple Cardiovascular: Normal rate, regular rhythm and normal heart sounds.  No murmur heard. No BLE edema. Pulmonary/Chest: Effort normal and breath sounds normal. No respiratory distress. Abdominal: Soft.  There is no tenderness. Psychiatric: Patient has a normal mood and affect. behavior is normal. Judgment and thought content normal.   Results for orders placed or performed in visit on 10/27/19  Novel Coronavirus, NAA ([facility])   Specimen: Nasopharyngeal(NP) swabs in vial transport medium   NASOPHARYNGE  TESTING  Result Value Ref Range   SARS-CoV-2, NAA Not Detected Not Detected      Assessment & Plan:   Problem List Items Addressed This Visit        Other   Generalized anxiety disorder - Primary    PHQ( and GAD scores improved.  Continue taking Prozac 40 mg daily.      Relevant Medications   FLUoxetine (PROZAC) 40 MG capsule   Mild episode of recurrent depressive disorder (H[facility])    PHQ( and GAD scores improved.  Continue taking Prozac 40 mg daily.      Relevant Medications   FLUoxetine (PROZAC) 40 MG capsule     Follow up plan: Return in about 3 months (around 10/31/2022) for follow up.

## 2022-07-31 ENCOUNTER — Other Ambulatory Visit: Payer: Self-pay

## 2022-07-31 ENCOUNTER — Ambulatory Visit: Payer: 59 | Admitting: Nurse Practitioner

## 2022-07-31 ENCOUNTER — Encounter: Payer: Self-pay | Admitting: Nurse Practitioner

## 2022-07-31 VITALS — BP 120/78 | HR 91 | Temp 97.9°F | Resp 16 | Ht 61.0 in | Wt 186.1 lb

## 2022-07-31 DIAGNOSIS — F330 Major depressive disorder, recurrent, mild: Secondary | ICD-10-CM

## 2022-07-31 DIAGNOSIS — F411 Generalized anxiety disorder: Secondary | ICD-10-CM | POA: Diagnosis not present

## 2022-07-31 MED ORDER — FLUOXETINE HCL 40 MG PO CAPS
40.0000 mg | ORAL_CAPSULE | Freq: Every day | ORAL | 0 refills | Status: AC
  Filled 2022-07-31 – 2022-09-28 (×2): qty 90, 90d supply, fill #0

## 2022-07-31 NOTE — Assessment & Plan Note (Signed)
PHQ( and GAD scores improved.  Continue taking Prozac 40 mg daily.

## 2022-08-18 ENCOUNTER — Other Ambulatory Visit: Payer: Self-pay

## 2022-09-28 ENCOUNTER — Other Ambulatory Visit: Payer: Self-pay

## 2022-11-03 ENCOUNTER — Ambulatory Visit: Payer: 59 | Admitting: Nurse Practitioner

## 2022-11-03 ENCOUNTER — Ambulatory Visit
Admission: RE | Admit: 2022-11-03 | Discharge: 2022-11-03 | Disposition: A | Discharge status: Home / Self Care | Payer: PRIVATE HEALTH INSURANCE | Source: Ambulatory Visit | Attending: Physician Assistant | Admitting: Physician Assistant

## 2022-11-03 ENCOUNTER — Other Ambulatory Visit: Payer: Self-pay | Admitting: Physician Assistant

## 2022-11-03 DIAGNOSIS — S8991XA Unspecified injury of right lower leg, initial encounter: Secondary | ICD-10-CM | POA: Insufficient documentation

## 2022-11-03 NOTE — Progress Notes (Deleted)
Name: Stacy Joseph   MRN: [id number]    DOB: 09/16/1991   Date:11/03/2022       Progress Note  Subjective  Chief Complaint  No chief complaint on file.   HPI  Patient presents for annual CPE.  Diet: *** Exercise: ***  Sleep: *** Last dental exam:*** Last eye exam: ***  [facility] Visit from 07/31/2022 in [facility]  AUDIT-C Score 0      Depression: Phq 9 is  {Desc; negative/positive:13464}    07/31/2022   10:20 AM 06/26/2022    9:15 AM 12/29/2021    9:19 AM 11/03/2021    9:25 AM 09/29/2021   10:45 AM  Depression screen PHQ 2/9  Decreased Interest 0 0 0 0 0  Down, Depressed, Hopeless 0 0 0 0 0  PHQ - 2 Score 0 0 0 0 0  Altered sleeping 0 1 0 0 0  Tired, decreased energy 1 1 0 0 0  Change in appetite '1 1 1 '$ 0 1  Feeling bad or failure about yourself  0 0 0 0 0  Trouble concentrating 0 0 0 0 0  Moving slowly or fidgety/restless 0 0 0 1 1  Suicidal thoughts 0 0 0 0 0  PHQ-9 Score '2 3 1 1 2  '$ Difficult doing work/chores Not difficult at all Somewhat difficult Not difficult at all Not difficult at all Not difficult at all   Hypertension: BP Readings from Last 3 Encounters:  07/31/22 120/78  06/26/22 110/68  12/29/21 118/78   Obesity: Wt Readings from Last 3 Encounters:  07/31/22 186 lb 1.6 oz (84.4 kg)  06/26/22 185 lb 1.6 oz (84 kg)  12/29/21 175 lb 1.6 oz (79.4 kg)   BMI Readings from Last 3 Encounters:  07/31/22 35.16 kg/m  06/26/22 34.97 kg/m  12/29/21 33.08 kg/m     Vaccines:  HPV: up to at age 41 , ask insurance if age between 50-45  Shingrix: 54-64 yo and ask insurance if covered when patient above 76 yo Pneumonia:  educated and discussed with patient. Flu: educated and discussed with patient.  Hep C Screening: due STD testing and prevention (HIV/chl/gon/syphilis): 11/16/2017 Intimate partner violence:*** Sexual History : Menstrual History/LMP/Abnormal Bleeding:  Incontinence Symptoms:   Breast  cancer:  - Last Mammogram: No concerns, does not qualify - BRCA gene screening: None  Osteoporosis: Discussed high calcium and vitamin D supplementation, weight bearing exercises  Cervical cancer screening: Attempted last year to do Pap and was unable to get speculum in.  Patient is not sexually active  Skin cancer: Discussed monitoring for atypical lesions  Colorectal cancer: No concerns, does not qualify Lung cancer:   Low Dose CT Chest recommended if Age 42-80 years, 20 pack-year currently smoking OR have quit w/in 15years. Patient does not qualify.   ECG: none  Advanced Care Planning: A voluntary discussion about advance care planning including the explanation and discussion of advance directives.  Discussed health care proxy and Living will, and the patient was able to identify a health care proxy as mom.  Patient does not have a living will at present time. If patient does have living will, I have requested they bring this to the clinic to be scanned in to their chart.  Lipids: Lab Results  Component Value Date   CHOL 146 11/16/2017   Lab Results  Component Value Date   HDL 35 (L) 11/16/2017   Lab Results  Component Value Date   LDLCALC 86 11/16/2017  Lab Results  Component Value Date   TRIG 152 (H) 11/16/2017   Lab Results  Component Value Date   CHOLHDL 4.2 11/16/2017   No results found for: "LDLDIRECT"  Glucose: Glucose, Bld  Date Value Ref Range Status  11/16/2017 96 65 - 99 mg/dL Final    Comment:    .            Fasting reference interval .     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[place]) 09/29/2021   Seasonal allergic rhinitis 11/15/2017   Asperger's disorder 11/15/2017   Viral warts 11/15/2017   H/O vitamin D deficiency 11/15/2017   Family history of diabetes mellitus 11/15/2017   Generalized anxiety disorder    Allergy     Past Surgical History:   Procedure Laterality Date   TONSILLECTOMY AND ADENOIDECTOMY      Family History  Problem Relation Age of Onset   Allergic rhinitis Mother    Diabetes Mother    Renal Disease Mother    Allergic rhinitis Father    Depression Father        Paternal side of family has strong history depression   ADD / ADHD Sister    Colon cancer Paternal Grandfather     Social History   Socioeconomic History   Marital status: Single    Spouse name: Not on file   Number of children: Not on file   Years of education: Not on file   Highest education level: Not on file  Occupational History   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  Tobacco Use   Smoking status: Never   Smokeless tobacco: Never  Vaping Use   Vaping Use: Never used  Substance and Sexual Activity   Alcohol use: No   Drug use: No   Sexual activity: Not Currently    Comment: Never been sexually active  Other Topics Concern   Not on file  Social History Narrative   She lives works at [employer] Strain: Low Risk  (11/03/2021)   Overall Financial Resource Strain (CARDIA)    Difficulty of Paying Living Expenses: Not hard at all  Food Insecurity: No Food Insecurity (11/03/2021)   Hunger Vital Sign    Worried About Running Out of Food in the Last Year: Never true    Ran Out of Food in the Last Year: Never true  Transportation Needs: No Transportation Needs (11/03/2021)   PRAPARE - Hydrologist (Medical): No    Lack of Transportation (Non-Medical): No  Physical Activity: Unknown (11/03/2021)   Exercise Vital Sign    Days of Exercise per Week: 5 days    Minutes of Exercise per Session: Not on file  Stress: No Stress Concern Present (11/03/2021)   [place]    Feeling of Stress : Not at all  Social Connections: Socially Isolated (11/03/2021)    Social Connection and Isolation Panel [NHANES]    Frequency of Communication with Friends and Family: Once a week    Frequency of Social Gatherings with Friends and Family: More than three times a week    Attends Religious Services: Never    Marine scientist or Organizations: No    Attends Archivist Meetings: Never    Marital Status: Never  married  Intimate Partner Violence: Not At Risk (11/03/2021)   Humiliation, Afraid, Rape, and Kick questionnaire    Fear of Current or Ex-Partner: No    Emotionally Abused: No    Physically Abused: No    Sexually Abused: No     Current Outpatient Medications:    diphenhydrAMINE (BENADRYL) 25 MG tablet, Take 25 mg by mouth every 6 (six) hours as needed., Disp: , Rfl:    FLUoxetine (PROZAC) 40 MG capsule, Take 1 capsule (40 mg total) by mouth daily., Disp: 90 capsule, Rfl: 0   loratadine (CLARITIN) 10 MG tablet, Take 10 mg by mouth daily., Disp: , Rfl:    Multiple Vitamins-Minerals (WOMENS DAILY FORM/FA/CA/FE) TABS, Take by mouth., Disp: , Rfl:   No Known Allergies   ROS  Constitutional: Negative for fever or weight change.  Respiratory: Negative for cough and shortness of breath.   Cardiovascular: Negative for chest pain or palpitations.  Gastrointestinal: Negative for abdominal pain, no bowel changes.  Musculoskeletal: Negative for gait problem or joint swelling.  Skin: Negative for rash.  Neurological: Negative for dizziness or headache.  No other specific complaints in a complete review of systems (except as listed in HPI above).   Objective  There were no vitals filed for this visit.  There is no height or weight on file to calculate BMI.  Physical Exam Constitutional: Patient appears well-developed and well-nourished. No distress.  HENT: Head: Normocephalic and atraumatic. Ears: B TMs ok, no erythema or effusion; Nose: Nose normal. Mouth/Throat: Oropharynx is clear and moist. No oropharyngeal exudate.  Eyes:  Conjunctivae and EOM are normal. Pupils are equal, round, and reactive to light. No scleral icterus.  Neck: Normal range of motion. Neck supple. No JVD present. No thyromegaly present.  Cardiovascular: Normal rate, regular rhythm and normal heart sounds.  No murmur heard. No BLE edema. Pulmonary/Chest: Effort normal and breath sounds normal. No respiratory distress. Abdominal: Soft. Bowel sounds are normal, no distension. There is no tenderness. no masses Breast: no lumps or masses, no nipple discharge or rashes FEMALE GENITALIA:  External genitalia normal External urethra normal Vaginal vault normal without discharge or lesions Cervix normal without discharge or lesions Bimanual exam normal without masses RECTAL: no rectal masses or hemorrhoids Musculoskeletal: Normal range of motion, no joint effusions. No gross deformities Neurological: he is alert and oriented to person, place, and time. No cranial nerve deficit. Coordination, balance, strength, speech and gait are normal.  Skin: Skin is warm and dry. No rash noted. No erythema.  Psychiatric: Patient has a normal mood and affect. behavior is normal. Judgment and thought content normal.   No results found for this or any previous visit (from the past 2160 hour(s)).   Fall Risk:    07/31/2022   10:19 AM 06/26/2022    9:12 AM 12/29/2021    9:16 AM 11/03/2021    9:22 AM 09/29/2021   10:39 AM  Fall Risk   Falls in the past year? 0 0 0 0 0  Number falls in past yr: 0 0 0 0 0  Injury with Fall? 0 0 0 0 0  Risk for fall due to :     No Fall Risks  Follow up Falls evaluation completed   Falls evaluation completed Falls prevention discussed   ***  Functional Status Survey:   ***  Assessment & Plan  There are no diagnoses linked to this encounter.  -USPSTF grade A and B recommendations reviewed with patient; age-appropriate recommendations, preventive care, screening  tests, etc discussed and encouraged; healthy living encouraged;  see AVS for patient education given to patient -Discussed importance of 150 minutes of physical activity weekly, eat two servings of fish weekly, eat one serving of tree nuts ( cashews, pistachios, pecans, almonds.Marland Kitchen) every other day, eat 6 servings of fruit/vegetables daily and drink plenty of water and avoid sweet beverages.   -Reviewed Health Maintenance: due for labs, pap

## 2022-11-04 ENCOUNTER — Encounter: Payer: Commercial Managed Care - PPO | Admitting: Nurse Practitioner

## 2022-11-04 DIAGNOSIS — Z1159 Encounter for screening for other viral diseases: Secondary | ICD-10-CM

## 2022-11-04 DIAGNOSIS — Z130 Encounter for screening for diseases of the blood and blood-forming organs and certain disorders involving the immune mechanism: Secondary | ICD-10-CM

## 2022-11-04 DIAGNOSIS — Z0000 Encounter for general adult medical examination without abnormal findings: Secondary | ICD-10-CM

## 2022-11-04 DIAGNOSIS — Z131 Encounter for screening for diabetes mellitus: Secondary | ICD-10-CM

## 2022-11-04 DIAGNOSIS — Z13220 Encounter for screening for lipoid disorders: Secondary | ICD-10-CM

## 2022-11-27 ENCOUNTER — Other Ambulatory Visit: Payer: Self-pay

## 2022-11-27 ENCOUNTER — Ambulatory Visit
Admission: RE | Admit: 2022-11-27 | Discharge: 2022-11-27 | Disposition: A | Discharge status: Home / Self Care | Payer: PRIVATE HEALTH INSURANCE | Source: Ambulatory Visit | Attending: Physician Assistant | Admitting: Physician Assistant

## 2022-11-27 ENCOUNTER — Other Ambulatory Visit: Payer: Self-pay | Admitting: Physician Assistant

## 2022-11-27 DIAGNOSIS — M25572 Pain in left ankle and joints of left foot: Secondary | ICD-10-CM | POA: Diagnosis present

## 2022-11-27 MED ORDER — PREDNISONE 20 MG PO TABS
40.0000 mg | ORAL_TABLET | Freq: Every day | ORAL | 0 refills | Status: DC
  Filled 2022-11-27: qty 12, 6d supply, fill #0

## 2022-11-27 MED ORDER — MELOXICAM 15 MG PO TABS
15.0000 mg | ORAL_TABLET | Freq: Every day | ORAL | 0 refills | Status: DC
  Filled 2022-11-27: qty 12, 12d supply, fill #0

## 2023-01-13 ENCOUNTER — Encounter: Payer: Self-pay | Admitting: Physical Therapy

## 2023-01-13 ENCOUNTER — Other Ambulatory Visit: Payer: Self-pay

## 2023-01-13 ENCOUNTER — Ambulatory Visit: Payer: PRIVATE HEALTH INSURANCE | Attending: Orthopaedic Surgery | Admitting: Physical Therapy

## 2023-01-13 DIAGNOSIS — R262 Difficulty in walking, not elsewhere classified: Secondary | ICD-10-CM | POA: Diagnosis present

## 2023-01-13 DIAGNOSIS — M25572 Pain in left ankle and joints of left foot: Secondary | ICD-10-CM | POA: Diagnosis present

## 2023-01-13 DIAGNOSIS — M6281 Muscle weakness (generalized): Secondary | ICD-10-CM | POA: Insufficient documentation

## 2023-01-13 NOTE — Therapy (Signed)
OUTPATIENT PHYSICAL THERAPY LOWER EXTREMITY EVALUATION   Patient Name: Stacy Joseph MRN: [id number] DOB:Oct 03, 1991, 32 y.o., female Today's Date: 01/13/2023  END OF SESSION:  PT End of Session - 01/13/23 1023     Visit Number 1    Number of Visits 12    Date for PT Re-Evaluation 02/24/23    PT Start Time 1021    PT Stop Time [id number]    PT Time Calculation (min) 73 min    Activity Tolerance Patient tolerated treatment well    Behavior During Therapy [facility] for tasks assessed/performed             Past Medical History:  Diagnosis Date   Allergy    Depression    Past Surgical History:  Procedure Laterality Date   TONSILLECTOMY AND ADENOIDECTOMY     Patient Active Problem List   Diagnosis Date Noted   BMI 32.0-32.9,adult 09/29/2021   Mild episode of recurrent depressive disorder 09/29/2021   Seasonal allergic rhinitis 11/15/2017   Asperger's disorder 11/15/2017   Viral warts 11/15/2017   H/O vitamin D deficiency 11/15/2017   Family history of diabetes mellitus 11/15/2017   Generalized anxiety disorder    Allergy     PCP: Bo Merino, FNP   REFERRING PROVIDER: Melrose Nakayama, MD   REFERRING DIAG: [phone] (ICD-10-CM) - Left ankle sprain   THERAPY DIAG:  Pain in left ankle and joints of left foot  Muscle weakness (generalized)  Difficulty in walking, not elsewhere classified  Rationale for Evaluation and Treatment: Rehabilitation  ONSET DATE: 11/03/23.   SUBJECTIVE:   SUBJECTIVE STATEMENT:  Reports that she still has pain in the R knee periodically and constant pain the L ankle with movement.    PERTINENT HISTORY: Pt Working at [employer] center for [employer]. On 11/03/23, Pt was getting totes off pallets, and pallet board broke causing pain in R knee and L ankle. Pt has mild baseline scoliosis  PAIN:  Are you having pain? Yes: NPRS scale: 3/10 Pain location: L ankle  Pain description: sharp  Aggravating factors: standing for long  periods for time Relieving factors: taking weight off ankle   PRECAUTIONS: None  WEIGHT BEARING RESTRICTIONS: No  FALLS:  Has patient fallen in last 6 months? Yes. Number of falls 1 at time of injury   LIVING ENVIRONMENT: Lives with: lives with their family Lives in: House/apartment Stairs: No Has following equipment at home: Single point cane has not used since first week since injury   OCCUPATION: Works in [place]: be able to lift without pain and perform work tasks without pain.   NEXT MD VISIT: 02/05/23  OBJECTIVE:   DIAGNOSTIC FINDINGS: EXAM: LEFT ANKLE COMPLETE - 3+ VIEW   COMPARISON:  None Available.   FINDINGS: There is no evidence of fracture, dislocation, or joint effusion. There is no evidence of arthropathy or other focal bone abnormality. Soft tissues are unremarkable.   IMPRESSION: Negative.  PATIENT SURVEYS:  FOTO 37   COGNITION: Overall cognitive status: Within functional limits for tasks assessed     SENSATION: WFL  EDEMA:   mild palpable edema over Cubiod.      POSTURE: rounded shoulders, forward head, and increased thoracic kyphosis mild lumbothoracic scoliosis   PALPATION: Noted edema in the lateral dorsum on the L foot.   LOWER EXTREMITY ROM:  Active ROM Right eval Left eval  Hip flexion [facility]  Hip extension [facility]   Hip abduction [facility]  Hip adduction WFL   Hip internal rotation [facility]   Hip external rotation [facility]   Knee flexion [facility] WFL  Knee extension [facility]  Ankle dorsiflexion 104 98(pain)  Ankle plantarflexion 153 130  Ankle inversion 30 20  Ankle eversion 40 40   (Blank rows = not tested)  LOWER EXTREMITY MMT:  MMT Right eval Left eval  Hip flexion 4+/5  4+/5   Hip extension 5/5 5/5  Hip abduction 4+/5  4+/5   Hip adduction 4+/5  4+/5   Hip internal rotation    Hip external rotation    Knee flexion 4+/5  5/5   Knee extension 4+/5 5/5  Ankle dorsiflexion 5/5 4-/5  Ankle  plantarflexion 5/5 4-/5  Ankle inversion 5/5 4-/5  Ankle eversion 5/5 4-/5   (Blank rows = not tested)  LOWER EXTREMITY SPECIAL TESTS:  Ankle special tests: Tibial torsion test: positive , Anterior drawer test: positive , Talar tilt test: positive , and Great toe extension test: negative  FUNCTIONAL TESTS:  5 times sit to stand: 15.45 Timed up and go (TUG): 10.8 6 minute walk test: TBD 10 meter walk test: 10.2 Berg Balance Scale: 52 Functional gait assessment: 18   [facility] PT Assessment - 01/13/23 0001       Berg Balance Test   Sit to Stand Able to stand without using hands and stabilize independently    Standing Unsupported Able to stand safely 2 minutes    Sitting with Back Unsupported but Feet Supported on Floor or Stool Able to sit safely and securely 2 minutes    Stand to Sit Sits safely with minimal use of hands    Transfers Able to transfer safely, minor use of hands    Standing Unsupported with Eyes Closed Able to stand 10 seconds safely    Standing Unsupported with Feet Together Able to place feet together independently and stand 1 minute safely    From Standing, Reach Forward with Outstretched Arm Can reach forward >12 cm safely (5")    From Standing Position, Pick up Object from Floor Able to pick up shoe, needs supervision    From Standing Position, Turn to Look Behind Over each Shoulder Looks behind from both sides and weight shifts well    Turn 360 Degrees Able to turn 360 degrees safely one side only in 4 seconds or less    Standing Unsupported, Alternately Place Feet on Step/Stool Able to stand independently and safely and complete 8 steps in 20 seconds    Standing Unsupported, One Foot in Front Able to plae foot ahead of the other independently and hold 30 seconds    Standing on One Leg Able to lift leg independently and hold > 10 seconds    Total Score 52      Functional Gait  Assessment   Gait Level Surface Walks 20 ft in less than 7 sec but greater than 5.5 sec,  uses assistive device, slower speed, mild gait deviations, or deviates 6-10 in outside of the 12 in walkway width.    Change in Gait Speed Able to change speed, demonstrates mild gait deviations, deviates 6-10 in outside of the 12 in walkway width, or no gait deviations, unable to achieve a major change in velocity, or uses a change in velocity, or uses an assistive device.    Gait with Horizontal Head Turns Performs head turns smoothly with slight change in gait velocity (eg, minor disruption to smooth gait path), deviates 6-10 in outside 12 in walkway width,  or uses an assistive device.    Gait with Vertical Head Turns Performs task with slight change in gait velocity (eg, minor disruption to smooth gait path), deviates 6 - 10 in outside 12 in walkway width or uses assistive device    Gait and Pivot Turn Pivot turns safely within 3 sec and stops quickly with no loss of balance.    Step Over Obstacle Is able to step over one shoe box (4.5 in total height) but must slow down and adjust steps to clear box safely. May require verbal cueing.    Gait with Narrow Base of Support Is able to ambulate for 10 steps heel to toe with no staggering.    Gait with Eyes Closed Walks 20 ft, slow speed, abnormal gait pattern, evidence for imbalance, deviates 10-15 in outside 12 in walkway width. Requires more than 9 sec to ambulate 20 ft.    Ambulating Backwards Walks 20 ft, slow speed, abnormal gait pattern, evidence for imbalance, deviates 10-15 in outside 12 in walkway width.    Steps Two feet to a stair, must use rail.    Total Score 18             GAIT: Distance walked: 108ft Assistive device utilized: None Level of assistance: Complete Independence Comments: antalgic on the LLE   TODAY'S TREATMENT:                                                                                                                              DATE: 01/13/2023   Pt instructed pt in HEP as listed below. Cues for movement to  stay within pain free range as tolerated.   PATIENT EDUCATION:  Education details: Pt educated on POC, rehab potential as well as  throughout session about proper posture and technique with exercises. Improved exercise technique, movement at target joints, use of target muscles after min to mod verbal, visual, tactile cues.  Person educated: Patient Education method: Customer service manager Education comprehension: verbalized understanding  HOME EXERCISE PROGRAM: URL: [email or web address] Date: 01/13/2023 Prepared by: Barrie Folk  Exercises - Ankle Inversion Eversion Towel Slide  - 1 x daily - 7 x weekly - 3 sets - 10 reps - Towel Scrunches  - 1 x daily - 7 x weekly - 3 sets - 10 reps - Seated Heel Slide  - 1 x daily - 7 x weekly - 3 sets - 10 reps  ASSESSMENT:  CLINICAL IMPRESSION: Patient is a 32 y.o. Female who was seen today for physical therapy evaluation and treatment for L ankle pain. Pt reports fall at work injuring R knee and L ankle. Unable to return to work until she is able to perform work duties with minimal to no pain including lifting heavy boxes of medical equipment. Pt demonstrating decreased ankle stability with positive anterior drawer and talar tilt. Pt's ankle weakness and instability limit tolerance to standing, and heavy lifting, preventing her from performing basic employment functions.  OBJECTIVE IMPAIRMENTS: Abnormal gait, decreased activity tolerance, decreased balance, decreased knowledge of use of DME, decreased mobility, difficulty walking, decreased ROM, and decreased strength.   ACTIVITY LIMITATIONS: carrying, bending, standing, squatting, stairs, and locomotion level  PARTICIPATION LIMITATIONS: driving, shopping, community activity, occupation, and yard work  PERSONAL FACTORS: Fitness, Past/current experiences, and Profession are also affecting patient's functional outcome.   REHAB POTENTIAL: Excellent  CLINICAL DECISION  MAKING: Stable/uncomplicated  EVALUATION COMPLEXITY: Moderate   GOALS: Goals reviewed with patient? Yes  SHORT TERM GOALS: Target date: 02/10/2023   Pt will independent with HEP to improve strength, ROM and ankle stability and allow improved independence with ADLS and reduce pain  Baseline: provided Goal status: INITIAL   LONG TERM GOALS: Target date: 02/24/2023    Pt will increase Foto to 59 to indicate improve function and reduced pain in LLE Baseline: 37 Goal status: INITIAL  2.  Pt will improve FGA to >19 to indicate reduced fall risk and improved safety with community mobility  Baseline:  Goal status: INITIAL  3.  Pt will increase 6 min walk test to >1239ft to indicate improved community access and reduced disability  Baseline:  Goal status: INITIAL  4.  Pt will increase L ankle DF to at least 4+/5 to allow improved functional with work tasks and returned to PLOF Baseline:  Goal status: INITIAL    PLAN:  PT FREQUENCY: 1-2x/week  PT DURATION: 8 weeks  PLANNED INTERVENTIONS: Therapeutic exercises, Therapeutic activity, Neuromuscular re-education, Balance training, Gait training, Patient/Family education, Self Care, Joint mobilization, Joint manipulation, Stair training, DME instructions, Dry Needling, Moist heat, and Manual therapy  PLAN FOR NEXT SESSION: complete 6 min walk test    Barrie Folk PT, DPT  Physical Therapist - [facility]  5:41 PM 01/13/23

## 2023-01-15 ENCOUNTER — Ambulatory Visit: Payer: PRIVATE HEALTH INSURANCE | Admitting: Physical Therapy

## 2023-01-15 DIAGNOSIS — R262 Difficulty in walking, not elsewhere classified: Secondary | ICD-10-CM

## 2023-01-15 DIAGNOSIS — M25572 Pain in left ankle and joints of left foot: Secondary | ICD-10-CM

## 2023-01-15 DIAGNOSIS — M6281 Muscle weakness (generalized): Secondary | ICD-10-CM

## 2023-01-15 NOTE — Therapy (Signed)
OUTPATIENT PHYSICAL THERAPY LOWER EXTREMITY treatment   Patient Name: Stacy Joseph MRN: [id number] DOB:[birth year], 32 y.o., female Today's Date: 01/15/2023  END OF SESSION:  PT End of Session - 01/15/23 0806     Visit Number 2    Number of Visits 12    Date for PT Re-Evaluation 02/24/23    PT Start Time 0804    Activity Tolerance Patient tolerated treatment well    Behavior During Therapy [facility] for tasks assessed/performed             Past Medical History:  Diagnosis Date   Allergy    Depression    Past Surgical History:  Procedure Laterality Date   TONSILLECTOMY AND ADENOIDECTOMY     Patient Active Problem List   Diagnosis Date Noted   BMI 32.0-32.9,adult 09/29/2021   Mild episode of recurrent depressive disorder 09/29/2021   Seasonal allergic rhinitis 11/15/2017   Asperger's disorder 11/15/2017   Viral warts 11/15/2017   H/O vitamin D deficiency 11/15/2017   Family history of diabetes mellitus 11/15/2017   Generalized anxiety disorder    Allergy     PCP: Berniece Salines, FNP   REFERRING PROVIDER: Marcene Corning, MD   REFERRING DIAG: [phone] (ICD-10-CM) - Left ankle sprain   THERAPY DIAG:  Pain in left ankle and joints of left foot  Difficulty in walking, not elsewhere classified  Muscle weakness (generalized)  Rationale for Evaluation and Treatment: Rehabilitation  ONSET DATE: 11/03/23.   SUBJECTIVE:   SUBJECTIVE STATEMENT: Pt reports that HEP went well yesterday, felt that she felt that she may have done them a little to fast. Mild pain 3/10 at rest in the L ankle on this day.   PERTINENT HISTORY: Pt Working at [employer] center for [employer]. On 11/03/23, Pt was getting totes off pallets, and pallet board broke causing pain in R knee and L ankle. Pt has mild baseline scoliosis  PAIN:  Are you having pain? Yes: NPRS scale: 3/10 Pain location: L ankle  Pain description: sharp  Aggravating factors: standing for long periods  for time Relieving factors: taking weight off ankle   PRECAUTIONS: None  WEIGHT BEARING RESTRICTIONS: No  FALLS:  Has patient fallen in last 6 months? Yes. Number of falls 1 at time of injury   LIVING ENVIRONMENT: Lives with: lives with their family Lives in: House/apartment Stairs: No Has following equipment at home: Single point cane has not used since first week since injury   OCCUPATION: Works in [employer]   PLOF: Independent  PATIENT GOALS: be able to lift without pain and perform work tasks without pain.   NEXT MD VISIT: 02/05/23  OBJECTIVE:   DIAGNOSTIC FINDINGS: EXAM: LEFT ANKLE COMPLETE - 3+ VIEW   COMPARISON:  None Available.   FINDINGS: There is no evidence of fracture, dislocation, or joint effusion. There is no evidence of arthropathy or other focal bone abnormality. Soft tissues are unremarkable.   IMPRESSION: Negative.  PATIENT SURVEYS:  FOTO 37   COGNITION: Overall cognitive status: Within functional limits for tasks assessed     SENSATION: WFL  EDEMA:   mild palpable edema over Cubiod.      POSTURE: rounded shoulders, forward head, and increased thoracic kyphosis mild lumbothoracic scoliosis   PALPATION: Noted edema in the lateral dorsum on the L foot.   LOWER EXTREMITY ROM:  Active ROM Right eval Left eval  Hip flexion [facility]  Hip extension [facility]   Hip abduction [facility]   Hip adduction [facility]  Hip internal rotation [facility]   [facility]   Knee flexion [facility]  [facility]  Ankle dorsiflexion 104 98(pain)  Ankle plantarflexion 153 130  Ankle inversion 30 20  Ankle eversion 40 40   (Blank rows = not tested)  LOWER EXTREMITY MMT:  MMT Right eval Left eval  Hip flexion 4+/5  4+/5   Hip extension 5/5 5/5  Hip abduction 4+/5  4+/5   Hip adduction 4+/5  4+/5   Hip internal rotation    Hip external rotation    Knee flexion 4+/5  5/5   Knee extension 4+/5 5/5  Ankle dorsiflexion 5/5 4-/5  Ankle  plantarflexion 5/5 4-/5  Ankle inversion 5/5 4-/5  Ankle eversion 5/5 4-/5   (Blank rows = not tested)  LOWER EXTREMITY SPECIAL TESTS:  Ankle special tests: Tibial torsion test: positive , Anterior drawer test: positive , Talar tilt test: positive , and Great toe extension test: negative  FUNCTIONAL TESTS:  5 times sit to stand: 15.45 Timed up and go (TUG): 10.8 6 minute walk test: TBD 10 meter walk test: 10.2 Berg Balance Scale: 52 Functional gait assessment: 18 SLS: RLE: 19.74 LLE: 3.95     GAIT: Distance walked: 1940ft Assistive device utilized: None Level of assistance: Complete Independence Comments: antalgic on the LLE   TODAY'S TREATMENT:                                                                                                                              DATE: 01/15/2023   HEP review.  Heel slides 2x 15  Ankle inversion/eversion 2  15 bil  Toe curls with pilllow case under foot 2 x 15  Cues for decreased speed for movement and hold at range  AROM ankle circles x 10 bil   BAPS  CW rotation 2x 15  CCW rotation 2x 15  SLS.  BUE supported on rails. 2 x 10 Bil.   Performed without UE support 19.7 sec on the R and 3.95  Lateral lunge To slide foot to the side  x 12 bil.   Side stepping R and L 2 x 128ft.     PATIENT EDUCATION:  Education details: Pt educated on POC, rehab potential as well as  throughout session about proper posture and technique with exercises. Improved exercise technique, movement at target joints, use of target muscles after min to mod verbal, visual, tactile cues.  Person educated: Patient Education method: Medical illustratorxplanation and Demonstration Education comprehension: verbalized understanding  HOME EXERCISE PROGRAM: URL: [email or web address] Date: 01/13/2023 Prepared by: Grier RocherAustin Kadin Bera  Exercises - Ankle Inversion Eversion Towel Slide  - 1 x daily - 7 x weekly - 3 sets - 10 reps - Towel Scrunches  - 1 x daily - 7 x weekly  - 3 sets - 10 reps - Seated Heel Slide  - 1 x daily - 7 x weekly - 3 sets - 10 reps  ASSESSMENT:  CLINICAL  IMPRESSION: Pt put forth excellent effort in PT treatment on this day for all activities. She is highly motivated to return to PLOF. Slight modifications made to technique to HEP. Increased load and ROM added to demand on BLE. Pt does not continued pain in the RLE with squat like movements, willl continue to monitor. Pt will continue to benefit from skilled PT to improve function, reduce pain and increase strength to allow return to PLOF and increased QoL.   OBJECTIVE IMPAIRMENTS: Abnormal gait, decreased activity tolerance, decreased balance, decreased knowledge of use of DME, decreased mobility, difficulty walking, decreased ROM, and decreased strength.   ACTIVITY LIMITATIONS: carrying, bending, standing, squatting, stairs, and locomotion level  PARTICIPATION LIMITATIONS: driving, shopping, community activity, occupation, and yard work  PERSONAL FACTORS: Fitness, Past/current experiences, and Profession are also affecting patient's functional outcome.   REHAB POTENTIAL: Excellent  CLINICAL DECISION MAKING: Stable/uncomplicated  EVALUATION COMPLEXITY: Moderate   GOALS: Goals reviewed with patient? Yes  SHORT TERM GOALS: Target date: 02/10/2023   Pt will independent with HEP to improve strength, ROM and ankle stability and allow improved independence with ADLS and reduce pain  Baseline: provided Goal status: INITIAL   LONG TERM GOALS: Target date: 02/24/2023    Pt will increase Foto to 59 to indicate improve function and reduced pain in LLE Baseline: 37 Goal status: INITIAL  2.  Pt will improve FGA to >19 to indicate reduced fall risk and improved safety with community mobility  Baseline:  Goal status: INITIAL  3.  Pt will increase 6 min walk test to >12800ft to indicate improved community access and reduced disability  Baseline:  Goal status: INITIAL  4.  Pt will  increase L ankle DF to at least 4+/5 to allow improved functional with work tasks and returned to PLOF Baseline:  Goal status: INITIAL    PLAN:  PT FREQUENCY: 1-2x/week  PT DURATION: 8 weeks  PLANNED INTERVENTIONS: Therapeutic exercises, Therapeutic activity, Neuromuscular re-education, Balance training, Gait training, Patient/Family education, Self Care, Joint mobilization, Joint manipulation, Stair training, DME instructions, Dry Needling, Moist heat, and Manual therapy  PLAN FOR NEXT SESSION:   complete 6 min walk test. Continue BLE ankle stability exercises.     Grier RocherAustin Morrison Masser PT, DPT  Physical Therapist - [facility]  [facility]  8:07 AM 01/15/23

## 2023-01-16 DIAGNOSIS — M25572 Pain in left ankle and joints of left foot: Secondary | ICD-10-CM | POA: Diagnosis present

## 2023-01-16 DIAGNOSIS — R262 Difficulty in walking, not elsewhere classified: Secondary | ICD-10-CM | POA: Diagnosis present

## 2023-01-16 DIAGNOSIS — M6281 Muscle weakness (generalized): Secondary | ICD-10-CM | POA: Diagnosis present

## 2023-01-19 ENCOUNTER — Ambulatory Visit: Payer: Self-pay | Admitting: Physical Therapy

## 2023-01-19 ENCOUNTER — Ambulatory Visit: Payer: PRIVATE HEALTH INSURANCE | Admitting: Physical Therapy

## 2023-01-19 DIAGNOSIS — M25572 Pain in left ankle and joints of left foot: Secondary | ICD-10-CM | POA: Diagnosis not present

## 2023-01-19 DIAGNOSIS — R262 Difficulty in walking, not elsewhere classified: Secondary | ICD-10-CM

## 2023-01-19 DIAGNOSIS — M6281 Muscle weakness (generalized): Secondary | ICD-10-CM

## 2023-01-19 NOTE — Therapy (Signed)
OUTPATIENT PHYSICAL THERAPY LOWER EXTREMITY treatment   Patient Name: Stacy Joseph MRN: [id number] DOB:1991/07/15, 32 y.o., female Today's Date: 01/19/2023  END OF SESSION:  PT End of Session - 01/19/23 0808     Visit Number 3    Number of Visits 12    Date for PT Re-Evaluation 02/24/23    PT Start Time 0803    PT Stop Time 0848    PT Time Calculation (min) 45 min    Activity Tolerance Patient tolerated treatment well    [facility]/performed             Past Medical History:  Diagnosis Date   Allergy    Depression    Past Surgical History:  Procedure Laterality Date   TONSILLECTOMY AND ADENOIDECTOMY     Patient Active Problem List   Diagnosis Date Noted   BMI 32.0-32.9,adult 09/29/2021   Mild episode of recurrent depressive disorder 09/29/2021   Seasonal allergic rhinitis 11/15/2017   Asperger's disorder 11/15/2017   Viral warts 11/15/2017   H/O vitamin D deficiency 11/15/2017   Family history of diabetes mellitus 11/15/2017   Generalized anxiety disorder    Allergy     PCP: Berniece SalinesPender, Julie F, FNP   REFERRING PROVIDER: Marcene Corningalldorf, Peter, MD   REFERRING DIAG: [phone] (ICD-10-CM) - Left ankle sprain   THERAPY DIAG:  Pain in left ankle and joints of left foot  Difficulty in walking, not elsewhere classified  Muscle weakness (generalized)  Rationale for Evaluation and Treatment: Rehabilitation  ONSET DATE: 11/03/23.   SUBJECTIVE:   SUBJECTIVE STATEMENT: Pt reports that HEP went well since last treatment session. Pt does endorse pain in the L ankle and R knee for a day or two following last PT treatment. Mild ankle pain 1-2/10    PERTINENT HISTORY: Pt Working at [employer] for [employer]. On 11/03/23, Pt was getting totes off pallets, and pallet board broke causing pain in R knee and L ankle. Pt has mild baseline scoliosis  PAIN:  Are you having pain? Yes: NPRS scale: 2/10 Pain location: L  ankle  Pain description: sharp  Aggravating factors: standing for long periods for time Relieving factors: taking weight off ankle   PRECAUTIONS: None  WEIGHT BEARING RESTRICTIONS: No  FALLS:  Has patient fallen in last 6 months? Yes. Number of falls 1 at time of injury   LIVING ENVIRONMENT: Lives with: lives with their family Lives in: House/apartment Stairs: No Has following equipment at home: Single point cane has not used since first week since injury   OCCUPATION: Works in [employer]   PLOF: Independent  PATIENT GOALS: be able to lift without pain and perform work tasks without pain.   NEXT MD VISIT: 02/05/23  OBJECTIVE:   DIAGNOSTIC FINDINGS: EXAM: LEFT ANKLE COMPLETE - 3+ VIEW   COMPARISON:  None Available.   FINDINGS: There is no evidence of fracture, dislocation, or joint effusion. There is no evidence of arthropathy or other focal bone abnormality. Soft tissues are unremarkable.   IMPRESSION: Negative.  PATIENT SURVEYS:  FOTO 37   COGNITION: Overall cognitive status: Within functional limits for tasks assessed     SENSATION: WFL  EDEMA:   mild palpable edema over Cubiod.      POSTURE: rounded shoulders, forward head, and increased thoracic kyphosis mild lumbothoracic scoliosis   PALPATION: Noted edema in the lateral dorsum on the L foot.   LOWER EXTREMITY ROM:  Active ROM Right eval Left eval  Hip  flexion [facility]  Hip extension [facility]   Hip abduction WFL   Hip adduction WFL   Hip internal rotation [facility]   Hip external rotation [facility]   Knee flexion [facility] WFL  Knee extension [facility] WFL  Ankle dorsiflexion 104 98(pain)  Ankle plantarflexion 153 130  Ankle inversion 30 20  Ankle eversion 40 40   (Blank rows = not tested)  LOWER EXTREMITY MMT:  MMT Right eval Left eval  Hip flexion 4+/5  4+/5   Hip extension 5/5 5/5  Hip abduction 4+/5  4+/5   Hip adduction 4+/5  4+/5   Hip internal rotation    Hip external rotation    Knee flexion  4+/5  5/5   Knee extension 4+/5 5/5  Ankle dorsiflexion 5/5 4-/5  Ankle plantarflexion 5/5 4-/5  Ankle inversion 5/5 4-/5  Ankle eversion 5/5 4-/5   (Blank rows = not tested)  LOWER EXTREMITY SPECIAL TESTS:  Ankle special tests: Tibial torsion test: positive , Anterior drawer test: positive , Talar tilt test: positive , and Great toe extension test: negative  FUNCTIONAL TESTS:  5 times sit to stand: 15.45 Timed up and go (TUG): 10.8 6 minute walk test: 1116 10 meter walk test: 10.2 Berg Balance Scale: 52 Functional gait assessment: 18 SLS: RLE: 19.74 LLE: 3.95     GAIT: Distance walked: 36ft Assistive device utilized: None Level of assistance: Complete Independence Comments: antalgic on the LLE   TODAY'S TREATMENT:                                                                                                                              DATE: 01/19/2023   HEP review.  Heel slides 2x 20  Ankle DF 2x 20  HS curl 2x12 LAQ 2 x 12  Hip abduction 2 x 12  Hip flexion 2 x 12  Ankle PF in sitting 2 x 20  Cues for hold at end range and to remain in pain free range.   6 Min Walk Test:  Instructed patient to ambulate as quickly and as safely as possible for 6 minutes using LRAD. Patient was allowed to take standing rest breaks without stopping the test, but if the patient required a sitting rest break the clock would be stopped and the test would be over.  Results: 1119 feet using ASO brace on the LLE with 3/10 pain at end of walk . Results indicate that the patient has reduced endurance with ambulation compared to age matched norms.  Age Matched Norms: 56-69 yo M: 59 F: 20, 62-79 yo M: 1 F: 471, 45-89 yo M: 417 F: 392 MDC: 58.21 meters (190.98 feet) or 50 meters (ANPTA Core Set of Outcome Measures for Adults with Neurologic Conditions, 2018)     PATIENT EDUCATION:  Education details: Pt educated on POC, rehab potential as well as  throughout session about proper posture  and technique with exercises. Improved exercise technique, movement at target joints, use  of target muscles after min to mod verbal, visual, tactile cues.  Person educated: Patient Education method: Medical illustrator Education comprehension: verbalized understanding  HOME EXERCISE PROGRAM: URL: [email or web address] Date: 01/13/2023 Prepared by: Grier Rocher  Exercises - Ankle Inversion Eversion Towel Slide  - 1 x daily - 7 x weekly - 3 sets - 10 reps - Towel Scrunches  - 1 x daily - 7 x weekly - 3 sets - 10 reps - Seated Heel Slide  - 1 x daily - 7 x weekly - 3 sets - 10 reps  ASSESSMENT:  CLINICAL IMPRESSION: Pt put forth excellent effort in PT treatment on this day for all activities. She is highly motivated to return to PLOF. Pt demonstrated reduced function with 6 min walk test of 1191ft. Pt reports decreased pain and stiffness in the L ankle following therex. Pt will continue to benefit from skilled PT to improve function, reduce pain and increase strength to allow return to PLOF and increased QoL.   OBJECTIVE IMPAIRMENTS: Abnormal gait, decreased activity tolerance, decreased balance, decreased knowledge of use of DME, decreased mobility, difficulty walking, decreased ROM, and decreased strength.   ACTIVITY LIMITATIONS: carrying, bending, standing, squatting, stairs, and locomotion level  PARTICIPATION LIMITATIONS: driving, shopping, community activity, occupation, and yard work  PERSONAL FACTORS: Fitness, Past/current experiences, and Profession are also affecting patient's functional outcome.   REHAB POTENTIAL: Excellent  CLINICAL DECISION MAKING: Stable/uncomplicated  EVALUATION COMPLEXITY: Moderate   GOALS: Goals reviewed with patient? Yes  SHORT TERM GOALS: Target date: 02/10/2023   Pt will independent with HEP to improve strength, ROM and ankle stability and allow improved independence with ADLS and reduce pain  Baseline: provided Goal  status: INITIAL   LONG TERM GOALS: Target date: 02/24/2023    Pt will increase Foto to 59 to indicate improve function and reduced pain in LLE Baseline: 37 Goal status: INITIAL  2.  Pt will improve FGA to >19 to indicate reduced fall risk and improved safety with community mobility  Baseline:  Goal status: INITIAL  3.  Pt will increase 6 min walk test to >1522ft to indicate improved community access and reduced disability  Baseline:  Goal status: REVISED  4.  Pt will increase L ankle DF to at least 4+/5 to allow improved functional with work tasks and returned to PLOF Baseline:  Goal status: INITIAL    PLAN:  PT FREQUENCY: 1-2x/week  PT DURATION: 8 weeks  PLANNED INTERVENTIONS: Therapeutic exercises, Therapeutic activity, Neuromuscular re-education, Balance training, Gait training, Patient/Family education, Self Care, Joint mobilization, Joint manipulation, Stair training, DME instructions, Dry Needling, Moist heat, and Manual therapy  PLAN FOR NEXT SESSION:   Continue LLE ankle, hip/knee/ankle strength and stability exercises.     Grier Rocher PT, DPT  Physical Therapist - [facility]  9:05 AM 01/19/23

## 2023-01-21 ENCOUNTER — Ambulatory Visit: Payer: PRIVATE HEALTH INSURANCE | Admitting: Physical Therapy

## 2023-01-21 DIAGNOSIS — M6281 Muscle weakness (generalized): Secondary | ICD-10-CM

## 2023-01-21 DIAGNOSIS — R262 Difficulty in walking, not elsewhere classified: Secondary | ICD-10-CM

## 2023-01-21 DIAGNOSIS — M25572 Pain in left ankle and joints of left foot: Secondary | ICD-10-CM

## 2023-01-21 NOTE — Therapy (Signed)
OUTPATIENT PHYSICAL THERAPY LOWER EXTREMITY treatment   Patient Name: Stacy Joseph MRN: [id number] DOB:04/21/1991, 32 y.o., female Today's Date: 01/21/2023  END OF SESSION:  PT End of Session - 01/21/23 0849     Visit Number 4    Number of Visits 12    Date for PT Re-Evaluation 02/24/23    PT Start Time 0848    PT Stop Time 0930    PT Time Calculation (min) 42 min    Activity Tolerance Patient tolerated treatment well    Behavior During Therapy [facility] for tasks assessed/performed              Past Medical History:  Diagnosis Date   Allergy    Depression    Past Surgical History:  Procedure Laterality Date   TONSILLECTOMY AND ADENOIDECTOMY     Patient Active Problem List   Diagnosis Date Noted   BMI 32.0-32.9,adult 09/29/2021   Mild episode of recurrent depressive disorder 09/29/2021   Seasonal allergic rhinitis 11/15/2017   Asperger's disorder 11/15/2017   Viral warts 11/15/2017   H/O vitamin D deficiency 11/15/2017   Family history of diabetes mellitus 11/15/2017   Generalized anxiety disorder    Allergy     PCP: Berniece Salines, FNP   REFERRING PROVIDER: Marcene Corning, MD   REFERRING DIAG: [phone] (ICD-10-CM) - Left ankle sprain   THERAPY DIAG:  Pain in left ankle and joints of left foot  Muscle weakness (generalized)  Difficulty in walking, not elsewhere classified  Rationale for Evaluation and Treatment: Rehabilitation  ONSET DATE: 11/03/23.   SUBJECTIVE:   SUBJECTIVE STATEMENT: Pt reports that she is doing well. Mild pain from walking into therapy gym from parking lot. No other updates.    PERTINENT HISTORY: Pt Working at [employer] center for [employer]. On 11/03/23, Pt was getting totes off pallets, and pallet board broke causing pain in R knee and L ankle. Pt has mild baseline scoliosis  PAIN:  Are you having pain? Yes: NPRS scale: 2/10 Pain location: L ankle  Pain description: sharp  Aggravating factors: standing  for long periods for time Relieving factors: taking weight off ankle   PRECAUTIONS: None  WEIGHT BEARING RESTRICTIONS: No  FALLS:  Has patient fallen in last 6 months? Yes. Number of falls 1 at time of injury   LIVING ENVIRONMENT: Lives with: lives with their family Lives in: House/apartment Stairs: No Has following equipment at home: Single point cane has not used since first week since injury   OCCUPATION: Works in [employer]   PLOF: Independent  PATIENT GOALS: be able to lift without pain and perform work tasks without pain.   NEXT MD VISIT: 02/05/23  OBJECTIVE:   DIAGNOSTIC FINDINGS: EXAM: LEFT ANKLE COMPLETE - 3+ VIEW   COMPARISON:  None Available.   FINDINGS: There is no evidence of fracture, dislocation, or joint effusion. There is no evidence of arthropathy or other focal bone abnormality. Soft tissues are unremarkable.   IMPRESSION: Negative.  PATIENT SURVEYS:  FOTO 37   COGNITION: Overall cognitive status: Within functional limits for tasks assessed     SENSATION: WFL  EDEMA:   mild palpable edema over Cubiod.      POSTURE: rounded shoulders, forward head, and increased thoracic kyphosis mild lumbothoracic scoliosis   PALPATION: Noted edema in the lateral dorsum on the L foot.   LOWER EXTREMITY ROM:  Active ROM Right eval Left eval  Hip flexion [facility]  Hip extension [facility]   Hip abduction [facility]  Hip adduction WFL   Hip internal rotation [facility]   Hip external rotation [facility]   Knee flexion [facility] WFL  Knee extension [facility]  Ankle dorsiflexion 104 98(pain)  Ankle plantarflexion 153 130  Ankle inversion 30 20  Ankle eversion 40 40   (Blank rows = not tested)  LOWER EXTREMITY MMT:  MMT Right eval Left eval  Hip flexion 4+/5  4+/5   Hip extension 5/5 5/5  Hip abduction 4+/5  4+/5   Hip adduction 4+/5  4+/5   Hip internal rotation    Hip external rotation    Knee flexion 4+/5  5/5   Knee extension 4+/5 5/5  Ankle dorsiflexion 5/5 4-/5   Ankle plantarflexion 5/5 4-/5  Ankle inversion 5/5 4-/5  Ankle eversion 5/5 4-/5   (Blank rows = not tested)  LOWER EXTREMITY SPECIAL TESTS:  Ankle special tests: Tibial torsion test: positive , Anterior drawer test: positive , Talar tilt test: positive , and Great toe extension test: negative  FUNCTIONAL TESTS:  5 times sit to stand: 15.45 Timed up and go (TUG): 10.8 6 minute walk test: 1116 10 meter walk test: 10.2 Berg Balance Scale: 52 Functional gait assessment: 18 SLS: RLE: 19.74sec LLE: 3.95sec     GAIT: Distance walked: 31ft Assistive device utilized: None Level of assistance: Complete Independence Comments: antalgic on the LLE   TODAY'S TREATMENT:                                                                                                                              DATE: 01/21/2023   Nustep BLE AAROM x 4 min with use of BUE to keep LLE in pain free range.   Sitting  Foot slide 2 x 20  Inversion/eversion x 20 each   Small rocker board PF/DF and lateral control x 20  Large rocker board PF/DF and lateral control x 20   Long sitting with YTB Ankle PF 2 x 10  Ankle DF 2x 10  Ankle inversion 2x8 Ankle eversion 2 x 8   Standing at rail  Small rocker board AP control x 15 with UE support And x 15 without UE support Small rocker board lateral control x 15 bil performed x 15 with UE support and x 12 without UE support.   Mild increase in L ankle soreness at end of PT treatment. Pt reports 2.5/10     PATIENT EDUCATION:  Education details: Pt educated on POC, rehab potential as well as  throughout session about proper posture and technique with exercises. Improved exercise technique, movement at target joints, use of target muscles after min to mod verbal, visual, tactile cues.  Person educated: Patient Education method: Medical illustrator Education comprehension: verbalized understanding  HOME EXERCISE PROGRAM: URL:  [email or web address] Date: 01/13/2023 Prepared by: Grier Rocher  Exercises - Ankle Inversion Eversion Towel Slide  - 1 x daily - 7 x weekly - 3 sets - 10 reps - Towel Scrunches  -  1 x daily - 7 x weekly - 3 sets - 10 reps - Seated Heel Slide  - 1 x daily - 7 x weekly - 3 sets - 10 reps  ASSESSMENT:  CLINICAL IMPRESSION: Pt put forth excellent effort in PT treatment on this day for all activities. Pt tolerated resistance strengthening to ankle without increased pain on this date and improved tolerance to stabilization exercises. Pt will continue to benefit from skilled PT to improve function, reduce pain and increase strength to allow return to PLOF and increased QoL.   OBJECTIVE IMPAIRMENTS: Abnormal gait, decreased activity tolerance, decreased balance, decreased knowledge of use of DME, decreased mobility, difficulty walking, decreased ROM, and decreased strength.   ACTIVITY LIMITATIONS: carrying, bending, standing, squatting, stairs, and locomotion level  PARTICIPATION LIMITATIONS: driving, shopping, community activity, occupation, and yard work  PERSONAL FACTORS: Fitness, Past/current experiences, and Profession are also affecting patient's functional outcome.   REHAB POTENTIAL: Excellent  CLINICAL DECISION MAKING: Stable/uncomplicated  EVALUATION COMPLEXITY: Moderate   GOALS: Goals reviewed with patient? Yes  SHORT TERM GOALS: Target date: 02/10/2023   Pt will independent with HEP to improve strength, ROM and ankle stability and allow improved independence with ADLS and reduce pain  Baseline: provided Goal status: INITIAL   LONG TERM GOALS: Target date: 02/24/2023    Pt will increase Foto to 59 to indicate improve function and reduced pain in LLE Baseline: 37 Goal status: INITIAL  2.  Pt will improve FGA to >19 to indicate reduced fall risk and improved safety with community mobility  Baseline:  Goal status: INITIAL  3.  Pt will increase 6 min  walk test to >151400ft to indicate improved community access and reduced disability  Baseline:  Goal status: REVISED  4.  Pt will increase L ankle DF to at least 4+/5 to allow improved functional with work tasks and returned to PLOF Baseline:  Goal status: INITIAL    PLAN:  PT FREQUENCY: 1-2x/week  PT DURATION: 8 weeks  PLANNED INTERVENTIONS: Therapeutic exercises, Therapeutic activity, Neuromuscular re-education, Balance training, Gait training, Patient/Family education, Self Care, Joint mobilization, Joint manipulation, Stair training, DME instructions, Dry Needling, Moist heat, and Manual therapy  PLAN FOR NEXT SESSION:   Continue LLE ankle, hip/knee/ankle strength and stability exercises.     Grier RocherAustin Masaye Gatchalian PT, DPT  Physical Therapist - [place]  [facility]  9:57 AM 01/21/23

## 2023-01-25 ENCOUNTER — Encounter: Payer: Self-pay | Admitting: Physical Therapy

## 2023-01-25 ENCOUNTER — Ambulatory Visit: Payer: PRIVATE HEALTH INSURANCE | Attending: Psychiatry | Admitting: Physical Therapy

## 2023-01-25 DIAGNOSIS — M6281 Muscle weakness (generalized): Secondary | ICD-10-CM | POA: Insufficient documentation

## 2023-01-25 DIAGNOSIS — M25572 Pain in left ankle and joints of left foot: Secondary | ICD-10-CM | POA: Insufficient documentation

## 2023-01-25 DIAGNOSIS — R262 Difficulty in walking, not elsewhere classified: Secondary | ICD-10-CM | POA: Insufficient documentation

## 2023-01-25 NOTE — Therapy (Signed)
OUTPATIENT PHYSICAL THERAPY LOWER EXTREMITY treatment   Patient Name: Stacy Joseph MRN: [id number] DOB:08/24/1991, 32 y.o., female Today's Date: 01/25/2023  END OF SESSION:  PT End of Session - 01/25/23 0752     Visit Number 5    Number of Visits 12    Date for PT Re-Evaluation 02/24/23    PT Start Time 0801    PT Stop Time 0847    PT Time Calculation (min) 46 min    Activity Tolerance Patient tolerated treatment well    Behavior During Therapy [facility] for tasks assessed/performed              Past Medical History:  Diagnosis Date   Allergy    Depression    Past Surgical History:  Procedure Laterality Date   TONSILLECTOMY AND ADENOIDECTOMY     Patient Active Problem List   Diagnosis Date Noted   BMI 32.0-32.9,adult 09/29/2021   Mild episode of recurrent depressive disorder 09/29/2021   Seasonal allergic rhinitis 11/15/2017   Asperger's disorder 11/15/2017   Viral warts 11/15/2017   H/O vitamin D deficiency 11/15/2017   Family history of diabetes mellitus 11/15/2017   Generalized anxiety disorder    Allergy     PCP: Berniece Salines, FNP   REFERRING PROVIDER: Marcene Corning, MD   REFERRING DIAG: [phone] (ICD-10-CM) - Left ankle sprain   THERAPY DIAG:  Pain in left ankle and joints of left foot  Muscle weakness (generalized)  Difficulty in walking, not elsewhere classified  Rationale for Evaluation and Treatment: Rehabilitation  ONSET DATE: 11/03/23.   SUBJECTIVE:   SUBJECTIVE STATEMENT: Pt reports that she is doing well. Mild pain from walking into therapy gym from parking lot. No other updates.    PERTINENT HISTORY: Pt Working at [employer] center for [employer]. On 11/03/23, Pt was getting totes off pallets, and pallet board broke causing pain in R knee and L ankle. Pt has mild baseline scoliosis  PAIN:  Are you having pain? Yes: NPRS scale: 2/10 Pain location: L ankle  Pain description: sharp  Aggravating factors: standing  for long periods for time Relieving factors: taking weight off ankle   PRECAUTIONS: None  WEIGHT BEARING RESTRICTIONS: No  FALLS:  Has patient fallen in last 6 months? Yes. Number of falls 1 at time of injury   LIVING ENVIRONMENT: Lives with: lives with their family Lives in: House/apartment Stairs: No Has following equipment at home: Single point cane has not used since first week since injury   OCCUPATION: Works in [employer]   PLOF: Independent  PATIENT GOALS: be able to lift without pain and perform work tasks without pain.   NEXT MD VISIT: 02/05/23  OBJECTIVE:   DIAGNOSTIC FINDINGS: EXAM: LEFT ANKLE COMPLETE - 3+ VIEW   COMPARISON:  None Available.   FINDINGS: There is no evidence of fracture, dislocation, or joint effusion. There is no evidence of arthropathy or other focal bone abnormality. Soft tissues are unremarkable.   IMPRESSION: Negative.  PATIENT SURVEYS:  FOTO 37   COGNITION: Overall cognitive status: Within functional limits for tasks assessed     SENSATION: WFL  EDEMA:   mild palpable edema over Cubiod.      POSTURE: rounded shoulders, forward head, and increased thoracic kyphosis mild lumbothoracic scoliosis   PALPATION: Noted edema in the lateral dorsum on the L foot.   LOWER EXTREMITY ROM:  Active ROM Right eval Left eval  Hip flexion [facility]  Hip extension [facility]   Hip abduction [facility]  Hip adduction [facility]   Hip internal rotation [facility]   Hip external rotation [facility]   Knee flexion [facility] WFL  Knee extension [facility]  Ankle dorsiflexion 104 98(pain)  Ankle plantarflexion 153 130  Ankle inversion 30 20  Ankle eversion 40 40   (Blank rows = not tested)  LOWER EXTREMITY MMT:  MMT Right eval Left eval  Hip flexion 4+/5  4+/5   Hip extension 5/5 5/5  Hip abduction 4+/5  4+/5   Hip adduction 4+/5  4+/5   Hip internal rotation    Hip external rotation    Knee flexion 4+/5  5/5   Knee extension 4+/5 5/5  Ankle dorsiflexion 5/5 4-/5   Ankle plantarflexion 5/5 4-/5  Ankle inversion 5/5 4-/5  Ankle eversion 5/5 4-/5   (Blank rows = not tested)  LOWER EXTREMITY SPECIAL TESTS:  Ankle special tests: Tibial torsion test: positive , Anterior drawer test: positive , Talar tilt test: positive , and Great toe extension test: negative  FUNCTIONAL TESTS:  5 times sit to stand: 15.45 Timed up and go (TUG): 10.8 6 minute walk test: 1116 10 meter walk test: 10.2 Berg Balance Scale: 52 Functional gait assessment: 18 SLS: RLE: 19.74sec LLE: 3.95sec     GAIT: Distance walked: 21ft Assistive device utilized: None Level of assistance: Complete Independence Comments: antalgic on the LLE   TODAY'S TREATMENT:                                                                                                                              DATE: 01/25/2023    Sitting  Foot slide on pillow case on floor 2 x 20  Inversion/eversion ROM on pillow case x 20 each   Ankle PF 2 x 10 RTB Ankle DF 2x 10 RTB Ankle inversion 2x8 RTB Ankle eversion 2 x 8  RTB Hip  abduction x 12 BLE RTB LAQ x 12 BLE RTB HS curl x 12 BLE RTB  HEP addended. See below.   Standing at rail:  Small rocker board AP control 2x 15 with UE support Static hold on small rocker board 3 x 45 sec without UE support  Standing on airex pad:  Lateral weight shift R and L 2 x 12   Partial squat 2x12 Reciprocal march 2 x 10    Pt reports decreased pain and stiffness in the L ankle at end of PT treatment.    PATIENT EDUCATION:  Education details: Pt educated on POC, rehab potential as well as  throughout session about proper posture and technique with exercises. Improved exercise technique, movement at target joints, use of target muscles after min to mod verbal, visual, tactile cues.  Person educated: Patient Education method: Medical illustrator Education comprehension: verbalized understanding  HOME EXERCISE PROGRAM: Access Code: [email or web address] URL:  [email or web address] Date: 01/25/2023 Prepared by: Grier Rocher  Exercises - Seated Ankle Eversion with Resistance  - 1 x daily - 7 x weekly - 3  sets - 10 reps - Seated Ankle Inversion with Resistance  - 1 x daily - 7 x weekly - 3 sets - 10 reps - Seated Ankle Dorsiflexion with Resistance  - 1 x daily - 7 x weekly - 3 sets - 10 reps - Sitting Knee Extension with Resistance  - 1 x daily - 7 x weekly - 3 sets - 10 reps - Seated Hip Abduction with Resistance  - 1 x daily - 7 x weekly - 3 sets - 10 reps - Seated Hamstring Curl with Anchored Resistance  - 1 x daily - 7 x weekly - 3 sets - 10 reps  URL: [email or web address] Date: 01/13/2023 Prepared by: Grier Rocher  Exercises - Ankle Inversion Eversion Towel Slide  - 1 x daily - 7 x weekly - 3 sets - 10 reps - Towel Scrunches  - 1 x daily - 7 x weekly - 3 sets - 10 reps - Seated Heel Slide  - 1 x daily - 7 x weekly - 3 sets - 10 reps  ASSESSMENT:  CLINICAL IMPRESSION: Pt put forth excellent effort in PT treatment on this day for all activities. PT treatment incorporating increased resistance and stabilization exercises on this day. No increased pain with increased demand on LLE. Pt does note some pain in the R knee with squatting movements. Pt will continue to benefit from skilled PT to improve function, reduce pain and increase strength to allow return to PLOF and increased QoL.   OBJECTIVE IMPAIRMENTS: Abnormal gait, decreased activity tolerance, decreased balance, decreased knowledge of use of DME, decreased mobility, difficulty walking, decreased ROM, and decreased strength.   ACTIVITY LIMITATIONS: carrying, bending, standing, squatting, stairs, and locomotion level  PARTICIPATION LIMITATIONS: driving, shopping, community activity, occupation, and yard work  PERSONAL FACTORS: Fitness, Past/current experiences, and Profession are also affecting patient's functional outcome.   REHAB POTENTIAL:  Excellent  CLINICAL DECISION MAKING: Stable/uncomplicated  EVALUATION COMPLEXITY: Moderate   GOALS: Goals reviewed with patient? Yes  SHORT TERM GOALS: Target date: 02/10/2023   Pt will independent with HEP to improve strength, ROM and ankle stability and allow improved independence with ADLS and reduce pain  Baseline: provided Goal status: INITIAL   LONG TERM GOALS: Target date: 02/24/2023    Pt will increase Foto to 59 to indicate improve function and reduced pain in LLE Baseline: 37 Goal status: INITIAL  2.  Pt will improve FGA to >19 to indicate reduced fall risk and improved safety with community mobility  Baseline:  Goal status: INITIAL  3.  Pt will increase 6 min walk test to >153ft to indicate improved community access and reduced disability  Baseline:  Goal status: REVISED  4.  Pt will increase L ankle DF to at least 4+/5 to allow improved functional with work tasks and returned to PLOF Baseline:  Goal status: INITIAL    PLAN:  PT FREQUENCY: 1-2x/week  PT DURATION: 8 weeks  PLANNED INTERVENTIONS: Therapeutic exercises, Therapeutic activity, Neuromuscular re-education, Balance training, Gait training, Patient/Family education, Self Care, Joint mobilization, Joint manipulation, Stair training, DME instructions, Dry Needling, Moist heat, and Manual therapy  PLAN FOR NEXT SESSION:   Continue LLE ankle ROM as well as hip/knee/ankle strength and stability exercises.     Grier Rocher PT, DPT  Physical Therapist - [place]  [facility]  8:53 AM 01/25/23

## 2023-01-26 NOTE — Therapy (Unsigned)
OUTPATIENT PHYSICAL THERAPY LOWER EXTREMITY treatment   Patient Name: Stacy Joseph MRN: [id number] DOB:[birth year], 32 y.o., female Today's Date: 01/26/2023  END OF SESSION:     Past Medical History:  Diagnosis Date   Allergy    Depression    Past Surgical History:  Procedure Laterality Date   TONSILLECTOMY AND ADENOIDECTOMY     Patient Active Problem List   Diagnosis Date Noted   BMI 32.0-32.9,adult 09/29/2021   Mild episode of recurrent depressive disorder 09/29/2021   Seasonal allergic rhinitis 11/15/2017   Asperger's disorder 11/15/2017   Viral warts 11/15/2017   H/O vitamin D deficiency 11/15/2017   Family history of diabetes mellitus 11/15/2017   Generalized anxiety disorder    Allergy     PCP: Berniece Salines, FNP   REFERRING PROVIDER: Marcene Corning, MD   REFERRING DIAG: [phone] (ICD-10-CM) - Left ankle sprain   THERAPY DIAG:  No diagnosis found.  Rationale for Evaluation and Treatment: Rehabilitation  ONSET DATE: 11/03/23.   SUBJECTIVE:   SUBJECTIVE STATEMENT: Pt reports that she is doing well. Mild pain from walking into therapy gym from parking lot. No other updates.    PERTINENT HISTORY: Pt Working at [employer] center for [employer]. On 11/03/23, Pt was getting totes off pallets, and pallet board broke causing pain in R knee and L ankle. Pt has mild baseline scoliosis  PAIN:  Are you having pain? Yes: NPRS scale: 2/10 Pain location: L ankle  Pain description: sharp  Aggravating factors: standing for long periods for time Relieving factors: taking weight off ankle   PRECAUTIONS: None  WEIGHT BEARING RESTRICTIONS: No  FALLS:  Has patient fallen in last 6 months? Yes. Number of falls 1 at time of injury   LIVING ENVIRONMENT: Lives with: lives with their family Lives in: House/apartment Stairs: No Has following equipment at home: Single point cane has not used since first week since injury   OCCUPATION: Works in  Naval architect   PLOF: Independent  PATIENT GOALS: be able to lift without pain and perform work tasks without pain.   NEXT MD VISIT: 02/05/23  OBJECTIVE:   DIAGNOSTIC FINDINGS: EXAM: LEFT ANKLE COMPLETE - 3+ VIEW   COMPARISON:  None Available.   FINDINGS: There is no evidence of fracture, dislocation, or joint effusion. There is no evidence of arthropathy or other focal bone abnormality. Soft tissues are unremarkable.   IMPRESSION: Negative.  PATIENT SURVEYS:  FOTO 37   COGNITION: Overall cognitive status: Within functional limits for tasks assessed     SENSATION: WFL  EDEMA:   mild palpable edema over Cubiod.      POSTURE: rounded shoulders, forward head, and increased thoracic kyphosis mild lumbothoracic scoliosis   PALPATION: Noted edema in the lateral dorsum on the L foot.   LOWER EXTREMITY ROM:  Active ROM Right eval Left eval  Hip flexion [facility]  Hip extension [facility]   Hip abduction [facility]   Hip adduction [facility]   Hip internal rotation [facility]   Hip external rotation [facility]   Knee flexion [facility]  Knee extension [facility]  Ankle dorsiflexion 104 98(pain)  Ankle plantarflexion 153 130  Ankle inversion 30 20  Ankle eversion 40 40   (Blank rows = not tested)  LOWER EXTREMITY MMT:  MMT Right eval Left eval  Hip flexion 4+/5  4+/5   Hip extension 5/5 5/5  Hip abduction 4+/5  4+/5   Hip adduction 4+/5  4+/5   Hip internal rotation    Hip  external rotation    Knee flexion 4+/5  5/5   Knee extension 4+/5 5/5  Ankle dorsiflexion 5/5 4-/5  Ankle plantarflexion 5/5 4-/5  Ankle inversion 5/5 4-/5  Ankle eversion 5/5 4-/5   (Blank rows = not tested)  LOWER EXTREMITY SPECIAL TESTS:  Ankle special tests: Tibial torsion test: positive , Anterior drawer test: positive , Talar tilt test: positive , and Great toe extension test: negative  FUNCTIONAL TESTS:  5 times sit to stand: 15.45 Timed up and go (TUG): 10.8 6 minute walk test: 1116 10 meter walk test:  10.2 Berg Balance Scale: 52 Functional gait assessment: 18 SLS: RLE: 19.74sec LLE: 3.95sec     GAIT: Distance walked: 59ft Assistive device utilized: None Level of assistance: Complete Independence Comments: antalgic on the LLE   TODAY'S TREATMENT:                                                                                                                              DATE: 01/26/2023    Sitting  Foot slide on pillow case on floor 2 x 20  Inversion/eversion ROM on pillow case x 20 each   Ankle PF 2 x 10 RTB Ankle DF 2x 10 RTB Ankle inversion 2x8 RTB Ankle eversion 2 x 8  RTB Hip  abduction x 12 BLE RTB LAQ x 12 BLE RTB HS curl x 12 BLE RTB  HEP addended. See below.   Standing at rail:  Small rocker board AP control 2x 15 with UE support Static hold on small rocker board 3 x 45 sec without UE support  Standing on airex pad:  Lateral weight shift R and L 2 x 12   Partial squat 2x12 Reciprocal march 2 x 10    Pt reports decreased pain and stiffness in the L ankle at end of PT treatment.    PATIENT EDUCATION:  Education details: Pt educated on POC, rehab potential as well as  throughout session about proper posture and technique with exercises. Improved exercise technique, movement at target joints, use of target muscles after min to mod verbal, visual, tactile cues.  Person educated: Patient Education method: Medical illustrator Education comprehension: verbalized understanding  HOME EXERCISE PROGRAM: Access Code: [email or web address] URL: [email or web address] Date: 01/25/2023 Prepared by: Grier Rocher  Exercises - Seated Ankle Eversion with Resistance  - 1 x daily - 7 x weekly - 3 sets - 10 reps - Seated Ankle Inversion with Resistance  - 1 x daily - 7 x weekly - 3 sets - 10 reps - Seated Ankle Dorsiflexion with Resistance  - 1 x daily - 7 x weekly - 3 sets - 10 reps - Sitting Knee Extension with Resistance  - 1 x daily - 7 x weekly - 3 sets  - 10 reps - Seated Hip Abduction with Resistance  - 1 x daily - 7 x weekly - 3 sets - 10 reps - Seated Hamstring Curl with Anchored Resistance  -  1 x daily - 7 x weekly - 3 sets - 10 reps  URL: [email or web address] Date: 01/13/2023 Prepared by: Grier Rocher  Exercises - Ankle Inversion Eversion Towel Slide  - 1 x daily - 7 x weekly - 3 sets - 10 reps - Towel Scrunches  - 1 x daily - 7 x weekly - 3 sets - 10 reps - Seated Heel Slide  - 1 x daily - 7 x weekly - 3 sets - 10 reps  ASSESSMENT:  CLINICAL IMPRESSION: Pt put forth excellent effort in PT treatment on this day for all activities. PT treatment incorporating increased resistance and stabilization exercises on this day. No increased pain with increased demand on LLE. Pt does note some pain in the R knee with squatting movements. Pt will continue to benefit from skilled PT to improve function, reduce pain and increase strength to allow return to PLOF and increased QoL.   OBJECTIVE IMPAIRMENTS: Abnormal gait, decreased activity tolerance, decreased balance, decreased knowledge of use of DME, decreased mobility, difficulty walking, decreased ROM, and decreased strength.   ACTIVITY LIMITATIONS: carrying, bending, standing, squatting, stairs, and locomotion level  PARTICIPATION LIMITATIONS: driving, shopping, community activity, occupation, and yard work  PERSONAL FACTORS: Fitness, Past/current experiences, and Profession are also affecting patient's functional outcome.   REHAB POTENTIAL: Excellent  CLINICAL DECISION MAKING: Stable/uncomplicated  EVALUATION COMPLEXITY: Moderate   GOALS: Goals reviewed with patient? Yes  SHORT TERM GOALS: Target date: 02/10/2023   Pt will independent with HEP to improve strength, ROM and ankle stability and allow improved independence with ADLS and reduce pain  Baseline: provided Goal status: INITIAL   LONG TERM GOALS: Target date: 02/24/2023    Pt will increase Foto to 59  to indicate improve function and reduced pain in LLE Baseline: 37 Goal status: INITIAL  2.  Pt will improve FGA to >19 to indicate reduced fall risk and improved safety with community mobility  Baseline:  Goal status: INITIAL  3.  Pt will increase 6 min walk test to >1555ft to indicate improved community access and reduced disability  Baseline:  Goal status: REVISED  4.  Pt will increase L ankle DF to at least 4+/5 to allow improved functional with work tasks and returned to PLOF Baseline:  Goal status: INITIAL    PLAN:  PT FREQUENCY: 1-2x/week  PT DURATION: 8 weeks  PLANNED INTERVENTIONS: Therapeutic exercises, Therapeutic activity, Neuromuscular re-education, Balance training, Gait training, Patient/Family education, Self Care, Joint mobilization, Joint manipulation, Stair training, DME instructions, Dry Needling, Moist heat, and Manual therapy  PLAN FOR NEXT SESSION:   Continue LLE ankle ROM as well as hip/knee/ankle strength and stability exercises.     Norman Herrlich PT ,DPT Physical Therapist- [facility]   5:12 PM 01/26/23

## 2023-01-27 ENCOUNTER — Ambulatory Visit: Payer: Worker's Compensation | Attending: Orthopaedic Surgery | Admitting: Physical Therapy

## 2023-01-27 DIAGNOSIS — M6281 Muscle weakness (generalized): Secondary | ICD-10-CM | POA: Diagnosis present

## 2023-01-27 DIAGNOSIS — M25572 Pain in left ankle and joints of left foot: Secondary | ICD-10-CM | POA: Diagnosis present

## 2023-01-27 DIAGNOSIS — R262 Difficulty in walking, not elsewhere classified: Secondary | ICD-10-CM | POA: Insufficient documentation

## 2023-01-30 DIAGNOSIS — M6281 Muscle weakness (generalized): Secondary | ICD-10-CM | POA: Diagnosis present

## 2023-01-30 DIAGNOSIS — R262 Difficulty in walking, not elsewhere classified: Secondary | ICD-10-CM | POA: Diagnosis present

## 2023-01-30 DIAGNOSIS — M25572 Pain in left ankle and joints of left foot: Secondary | ICD-10-CM | POA: Diagnosis present

## 2023-02-01 ENCOUNTER — Ambulatory Visit: Payer: Worker's Compensation | Admitting: Physical Therapy

## 2023-02-01 DIAGNOSIS — R262 Difficulty in walking, not elsewhere classified: Secondary | ICD-10-CM

## 2023-02-01 DIAGNOSIS — M6281 Muscle weakness (generalized): Secondary | ICD-10-CM

## 2023-02-01 DIAGNOSIS — M25572 Pain in left ankle and joints of left foot: Secondary | ICD-10-CM

## 2023-02-01 NOTE — Therapy (Unsigned)
OUTPATIENT PHYSICAL THERAPY LOWER EXTREMITY Treatment   Patient Name: Stacy Joseph MRN: [id number] DOB:[birth year], 32 y.o., female Today's Date: 02/01/2023  END OF SESSION:  PT End of Session - 02/01/23 0937     Visit Number 7    Number of Visits 12    Date for PT Re-Evaluation 02/24/23    Progress Note Due on Visit 10    PT Start Time 0935    PT Stop Time 1015    PT Time Calculation (min) 40 min    Activity Tolerance Patient tolerated treatment well    Behavior During Therapy [facility] for tasks assessed/performed               Past Medical History:  Diagnosis Date   Allergy    Depression    Past Surgical History:  Procedure Laterality Date   TONSILLECTOMY AND ADENOIDECTOMY     Patient Active Problem List   Diagnosis Date Noted   BMI 32.0-32.9,adult 09/29/2021   Mild episode of recurrent depressive disorder 09/29/2021   Seasonal allergic rhinitis 11/15/2017   Asperger's disorder 11/15/2017   Viral warts 11/15/2017   H/O vitamin D deficiency 11/15/2017   Family history of diabetes mellitus 11/15/2017   Generalized anxiety disorder    Allergy     PCP: Berniece Salines, FNP   REFERRING PROVIDER: Marcene Corning, MD   REFERRING DIAG: [phone] (ICD-10-CM) - Left ankle sprain   THERAPY DIAG:  Pain in left ankle and joints of left foot  Muscle weakness (generalized)  Difficulty in walking, not elsewhere classified  Rationale for Evaluation and Treatment: Rehabilitation  ONSET DATE: 11/03/23.   SUBJECTIVE:   SUBJECTIVE STATEMENT: Pt reports that she is doing well. No pain at rest, but mild discomfort with prolonged walking and standing while shopping at used book store over the weekend.    PERTINENT HISTORY: Pt Working at [employer] center for [employer]. On 11/03/23, Pt was getting totes off pallets, and pallet board broke causing pain in R knee and L ankle. Pt has mild baseline scoliosis  PAIN:  Are you having pain? Yes: NPRS scale:  2/10 Pain location: L ankle  Pain description: sharp  Aggravating factors: standing for long periods for time Relieving factors: taking weight off ankle   PRECAUTIONS: None Latex Allergies   WEIGHT BEARING RESTRICTIONS: No  FALLS:  Has patient fallen in last 6 months? Yes. Number of falls 1 at time of injury   LIVING ENVIRONMENT: Lives with: lives with their family Lives in: House/apartment Stairs: No Has following equipment at home: Single point cane has not used since first week since injury   OCCUPATION: Works in [employer] architect   PLOF: Independent  PATIENT GOALS: be able to lift without pain and perform work tasks without pain.   NEXT MD VISIT: 02/05/23  OBJECTIVE:   DIAGNOSTIC FINDINGS: EXAM: LEFT ANKLE COMPLETE - 3+ VIEW   COMPARISON:  None Available.   FINDINGS: There is no evidence of fracture, dislocation, or joint effusion. There is no evidence of arthropathy or other focal bone abnormality. Soft tissues are unremarkable.   IMPRESSION:  Negative.  PATIENT SURVEYS:  FOTO 37   COGNITION: Overall cognitive status: Within functional limits for tasks assessed     SENSATION: WFL  EDEMA:   mild palpable edema over Cubiod.      POSTURE: rounded shoulders, forward head, and increased thoracic kyphosis mild lumbothoracic scoliosis   PALPATION: Noted edema in the lateral dorsum on the L foot.   LOWER EXTREMITY ROM:  Active ROM Right eval Left eval  Hip flexion [facility]  Hip extension [facility]   Hip abduction WFL   Hip adduction WFL   Hip internal rotation [facility]   Hip external rotation [facility]   Knee flexion [facility] WFL  Knee extension [facility] WFL  Ankle dorsiflexion 104 98(pain)  Ankle plantarflexion 153 130  Ankle inversion 30 20  Ankle eversion 40 40   (Blank rows = not tested)  LOWER EXTREMITY MMT:  MMT Right eval Left eval  Hip flexion 4+/5  4+/5   Hip extension 5/5 5/5  Hip abduction 4+/5  4+/5   Hip adduction 4+/5  4+/5   Hip internal rotation     Hip external rotation    Knee flexion 4+/5  5/5   Knee extension 4+/5 5/5  Ankle dorsiflexion 5/5 4-/5  Ankle plantarflexion 5/5 4-/5  Ankle inversion 5/5 4-/5  Ankle eversion 5/5 4-/5   (Blank rows = not tested)  LOWER EXTREMITY SPECIAL TESTS:  Ankle special tests: Tibial torsion test: positive , Anterior drawer test: positive , Talar tilt test: positive , and Great toe extension test: negative  FUNCTIONAL TESTS:  5 times sit to stand: 15.45 Timed up and go (TUG): 10.8 6 minute walk test: 1116 10 meter walk test: 10.2 Berg Balance Scale: 52 Functional gait assessment: 18 SLS: RLE: 19.74sec LLE: 3.95sec   GAIT: Distance walked: 60ft Assistive device utilized: None Level of assistance: Complete Independence Comments: antalgic on the LLE   TODAY'S TREATMENT:                                                                                                                              DATE: 02/01/2023   Nustep level AAROM for L ankle and R knee level 1 x 5 min. Noted to have increased ROM in the L ankle with increased time.   Sitting  Inversion/eversion ROM on pillow case x 20 each   Toe Yoga x 15 big toe and smaller toe alternating  Arch lifts x 15 - cues for correct muscle activation  Toe splaying for intrinsic foot muscle activation x 15 reps  LAQ x 12 BLE non LatexRTB   NMR  Reciprocal march 2 x 10 with BUE support on first bout and no AD on second Ball toss on airex pad and catch with adducted stance x 25 reps  Sit<>stand on airex pad x 10 Bil  SLS on level surface with BUE support on rails 3 x 10 sec bil  Partial squat x 8 with emphasis on hip hinge.    PATIENT EDUCATION:  Education details: Pt educated on POC, rehab potential as well as  throughout session about proper posture and technique with exercises. Improved exercise technique, movement at target joints, use of target muscles after min to mod verbal, visual, tactile cues.  Person educated:  Patient Education method: Medical illustrator Education comprehension: verbalized understanding  HOME EXERCISE PROGRAM: Access Code: [email or web address] URL: [email or web address] Date:  01/25/2023 Prepared by: Grier Rocher  Exercises - Seated Ankle Eversion with Resistance  - 1 x daily - 7 x weekly - 3 sets - 10 reps - Seated Ankle Inversion with Resistance  - 1 x daily - 7 x weekly - 3 sets - 10 reps - Seated Ankle Dorsiflexion with Resistance  - 1 x daily - 7 x weekly - 3 sets - 10 reps - Sitting Knee Extension with Resistance  - 1 x daily - 7 x weekly - 3 sets - 10 reps - Seated Hip Abduction with Resistance  - 1 x daily - 7 x weekly - 3 sets - 10 reps - Seated Hamstring Curl with Anchored Resistance  - 1 x daily - 7 x weekly - 3 sets - 10 reps  URL: [email or web address] Date: 01/13/2023 Prepared by: Grier Rocher  Exercises - Ankle Inversion Eversion Towel Slide  - 1 x daily - 7 x weekly - 3 sets - 10 reps - Towel Scrunches  - 1 x daily - 7 x weekly - 3 sets - 10 reps - Seated Heel Slide  - 1 x daily - 7 x weekly - 3 sets - 10 reps  ASSESSMENT:  CLINICAL IMPRESSION:  Pt put forth excellent effort in PT treatment on this day for all activities. PT treatment incorporating increased repetitions and adding new foot intrinsic muscle activation and stabilization exercises on this day. Pt still having trouble with higher level walking and balance activities but her tolerance for lower level activities. Pt will continue to benefit from skilled PT to improve function, reduce pain and increase strength to allow return to PLOF and increased QoL.   OBJECTIVE IMPAIRMENTS: Abnormal gait, decreased activity tolerance, decreased balance, decreased knowledge of use of DME, decreased mobility, difficulty walking, decreased ROM, and decreased strength.   ACTIVITY LIMITATIONS: carrying, bending, standing, squatting, stairs, and locomotion level  PARTICIPATION  LIMITATIONS: driving, shopping, community activity, occupation, and yard work  PERSONAL FACTORS: Fitness, Past/current experiences, and Profession are also affecting patient's functional outcome.   REHAB POTENTIAL: Excellent  CLINICAL DECISION MAKING: Stable/uncomplicated  EVALUATION COMPLEXITY: Moderate   GOALS: Goals reviewed with patient? Yes  SHORT TERM GOALS: Target date: 02/10/2023   Pt will independent with HEP to improve strength, ROM and ankle stability and allow improved independence with ADLS and reduce pain  Baseline: provided Goal status: INITIAL   LONG TERM GOALS: Target date: 02/24/2023    Pt will increase Foto to 59 to indicate improve function and reduced pain in LLE Baseline: 37 Goal status: INITIAL  2.  Pt will improve FGA to >19 to indicate reduced fall risk and improved safety with community mobility  Baseline:  Goal status: INITIAL  3.  Pt will increase 6 min walk test to >1569ft to indicate improved community access and reduced disability  Baseline:  Goal status: REVISED  4.  Pt will increase L ankle DF to at least 4+/5 to allow improved functional with work tasks and returned to PLOF Baseline:  Goal status: INITIAL    PLAN:  PT FREQUENCY: 1-2x/week  PT DURATION: 8 weeks  PLANNED INTERVENTIONS: Therapeutic exercises, Therapeutic activity, Neuromuscular re-education, Balance training, Gait training, Patient/Family education, Self Care, Joint mobilization, Joint manipulation, Stair training, DME instructions, Dry Needling, Moist heat, and Manual therapy  PLAN FOR NEXT SESSION:   Continue LLE ankle ROM as well as hip/knee/ankle strength and stability exercises.     Golden Pop PT ,DPT Physical Therapist- [place]  [facility]  10:19 AM 02/01/23

## 2023-02-03 ENCOUNTER — Ambulatory Visit: Payer: PRIVATE HEALTH INSURANCE | Attending: Nurse Practitioner | Admitting: Physical Therapy

## 2023-02-03 DIAGNOSIS — R262 Difficulty in walking, not elsewhere classified: Secondary | ICD-10-CM

## 2023-02-03 DIAGNOSIS — M25572 Pain in left ankle and joints of left foot: Secondary | ICD-10-CM | POA: Diagnosis present

## 2023-02-03 DIAGNOSIS — M6281 Muscle weakness (generalized): Secondary | ICD-10-CM | POA: Diagnosis present

## 2023-02-03 NOTE — Therapy (Signed)
OUTPATIENT PHYSICAL THERAPY LOWER EXTREMITY Treatment   Patient Name: Stacy Joseph MRN: [id number] DOB:[birth year], 32 y.o., female Today's Date: 02/03/2023  END OF SESSION:  PT End of Session - 02/03/23 0804     Visit Number 8    Number of Visits 12    Date for PT Re-Evaluation 02/24/23    Progress Note Due on Visit 10    PT Start Time 0801    PT Stop Time 0844    PT Time Calculation (min) 43 min    Activity Tolerance Patient tolerated treatment well    Behavior During Therapy [facility] for tasks assessed/performed               Past Medical History:  Diagnosis Date   Allergy    Depression    Past Surgical History:  Procedure Laterality Date   TONSILLECTOMY AND ADENOIDECTOMY     Patient Active Problem List   Diagnosis Date Noted   BMI 32.0-32.9,adult 09/29/2021   Mild episode of recurrent depressive disorder 09/29/2021   Seasonal allergic rhinitis 11/15/2017   Asperger's disorder 11/15/2017   Viral warts 11/15/2017   H/O vitamin D deficiency 11/15/2017   Family history of diabetes mellitus 11/15/2017   Generalized anxiety disorder    Allergy     PCP: Berniece Salines, FNP   REFERRING PROVIDER: Marcene Corning, MD   REFERRING DIAG: [phone] (ICD-10-CM) - Left ankle sprain   THERAPY DIAG:  Pain in left ankle and joints of left foot  Muscle weakness (generalized)  Difficulty in walking, not elsewhere classified  Rationale for Evaluation and Treatment: Rehabilitation  ONSET DATE: 11/03/23.   SUBJECTIVE:   SUBJECTIVE STATEMENT: Pt reports that she is doing well. No pain in ankle at start of PT treatment, reports mild pain while walking into building, but resolved at rest. .    PERTINENT HISTORY: Pt Working at [employer] for [employer]. On 11/03/23, Pt was getting totes off pallets, and pallet board broke causing pain in R knee and L ankle. Pt has mild baseline scoliosis  PAIN:  Are you having pain? Yes: NPRS scale: 2/10 Pain  location: L ankle  Pain description: sharp  Aggravating factors: standing for long periods for time Relieving factors: taking weight off ankle   PRECAUTIONS: None Latex Allergies   WEIGHT BEARING RESTRICTIONS: No  FALLS:  Has patient fallen in last 6 months? Yes. Number of falls 1 at time of injury   LIVING ENVIRONMENT: Lives with: lives with their family Lives in: House/apartment Stairs: No Has following equipment at home: Single point cane has not used since first week since injury   OCCUPATION: Works in [employer] architect   PLOF: Independent  PATIENT GOALS: be able to lift without pain and perform work tasks without pain.   NEXT MD VISIT: 02/05/23  OBJECTIVE:   DIAGNOSTIC FINDINGS: EXAM: LEFT ANKLE COMPLETE - 3+ VIEW   COMPARISON:  None Available.   FINDINGS: There is no evidence of fracture, dislocation, or joint effusion. There is no evidence of arthropathy or other focal bone abnormality. Soft tissues are unremarkable.   IMPRESSION:  Negative.  PATIENT SURVEYS:  FOTO 37   COGNITION: Overall cognitive status: Within functional limits for tasks assessed     SENSATION: WFL  EDEMA:   mild palpable edema over Cubiod.      POSTURE: rounded shoulders, forward head, and increased thoracic kyphosis mild lumbothoracic scoliosis   PALPATION: Noted edema in the lateral dorsum on the L foot.   LOWER EXTREMITY ROM:  Active ROM Right eval Left eval  Hip flexion [facility]  Hip extension [facility]   Hip abduction WFL   Hip adduction WFL   Hip internal rotation [facility]   Hip external rotation [facility]   Knee flexion [facility] WFL  Knee extension [facility] WFL  Ankle dorsiflexion 104 98(pain)  Ankle plantarflexion 153 130  Ankle inversion 30 20  Ankle eversion 40 40   (Blank rows = not tested)  LOWER EXTREMITY MMT:  MMT Right eval Left eval  Hip flexion 4+/5  4+/5   Hip extension 5/5 5/5  Hip abduction 4+/5  4+/5   Hip adduction 4+/5  4+/5   Hip internal rotation    Hip  external rotation    Knee flexion 4+/5  5/5   Knee extension 4+/5 5/5  Ankle dorsiflexion 5/5 4-/5  Ankle plantarflexion 5/5 4-/5  Ankle inversion 5/5 4-/5  Ankle eversion 5/5 4-/5   (Blank rows = not tested)  LOWER EXTREMITY SPECIAL TESTS:  Ankle special tests: Tibial torsion test: positive , Anterior drawer test: positive , Talar tilt test: positive , and Great toe extension test: negative  FUNCTIONAL TESTS:  5 times sit to stand: 15.45 Timed up and go (TUG): 10.8 6 minute walk test: 1116 10 meter walk test: 10.2 Berg Balance Scale: 52 Functional gait assessment: 18 SLS: RLE: 19.74sec LLE: 3.95sec   GAIT: Distance walked: 24ft Assistive device utilized: None Level of assistance: Complete Independence Comments: antalgic on the LLE   TODAY'S TREATMENT:                                                                                                                              DATE: 02/03/2023   Nustep level AAROM for L ankle and R knee level 3 x 5 min. Noted to have increased ROM in the L ankle with increased time.   Sitting  Heel slide for increased ankle DF ROM x 15 Inversion/eversion ROM on pillow case x 20 each   Seated calf raise  Toe Yoga x 20 big toe and smaller toe alternating  Arch lifts x 15 - cues for correct muscle activation  Toe splaying for intrinsic foot muscle activation x 15 reps    Standing  Arch lifts x 15 - UE supported on rail at wall Standing on airex pad Reciprocal march x 10 with BUE support on  Standing HS curl AROM x 15 bil  Ball toss on airex pad and catch with adducted stance x 25 reps  Sit<>stand on airex pad x 10 Bil  SLS on level surface with BUE support on rails 3 x 10 sec bil; attempted no UE support on the LLE< but unable due to pain in the L ankle  Standing on airex beam x 30 sec  Side stepping on airex beam 2 steps R and L x 4 each    PATIENT EDUCATION:  Education details: Pt educated on POC, rehab potential as well as  throughout session about proper posture and technique with exercises. Improved exercise technique, movement at target joints, use of target muscles after min to mod verbal, visual, tactile cues.  Person educated: Patient Education method: Medical illustrator Education comprehension: verbalized understanding  HOME EXERCISE PROGRAM: Access Code: [email or web address] URL: [email or web address] Date: 01/25/2023 Prepared by: Grier Rocher  Exercises - Seated Ankle Eversion with Resistance  - 1 x daily - 7 x weekly - 3 sets - 10 reps - Seated Ankle Inversion with Resistance  - 1 x daily - 7 x weekly - 3 sets - 10 reps - Seated Ankle Dorsiflexion with Resistance  - 1 x daily - 7 x weekly - 3 sets - 10 reps - Sitting Knee Extension with Resistance  - 1 x daily - 7 x weekly - 3 sets - 10 reps - Seated Hip Abduction with Resistance  - 1 x daily - 7 x weekly - 3 sets - 10 reps - Seated Hamstring Curl with Anchored Resistance  - 1 x daily - 7 x weekly - 3 sets - 10 reps  URL: [email or web address] Date: 01/13/2023 Prepared by: Grier Rocher  Exercises - Ankle Inversion Eversion Towel Slide  - 1 x daily - 7 x weekly - 3 sets - 10 reps - Towel Scrunches  - 1 x daily - 7 x weekly - 3 sets - 10 reps - Seated Heel Slide  - 1 x daily - 7 x weekly - 3 sets - 10 reps  ASSESSMENT:  CLINICAL IMPRESSION:  Pt put forth excellent effort in PT treatment on this day for all activities. PT added increased stability exercise. Noted to have pain in the LLE while performing SLS without UE support. But improved tolerance from prior sessions. Pt also noted to have continue swelling over the L cuboid. Pt will continue to benefit from skilled PT to improve function, reduce pain and increase strength to allow return to PLOF and increased QoL.   OBJECTIVE IMPAIRMENTS: Abnormal gait, decreased activity tolerance, decreased balance, decreased knowledge of use of DME, decreased mobility,  difficulty walking, decreased ROM, and decreased strength.   ACTIVITY LIMITATIONS: carrying, bending, standing, squatting, stairs, and locomotion level  PARTICIPATION LIMITATIONS: driving, shopping, community activity, occupation, and yard work  PERSONAL FACTORS: Fitness, Past/current experiences, and Profession are also affecting patient's functional outcome.   REHAB POTENTIAL: Excellent  CLINICAL DECISION MAKING: Stable/uncomplicated  EVALUATION COMPLEXITY: Moderate   GOALS: Goals reviewed with patient? Yes  SHORT TERM GOALS: Target date: 02/10/2023   Pt will independent with HEP to improve strength, ROM and ankle stability and allow improved independence with ADLS and reduce pain  Baseline: provided Goal status: INITIAL   LONG TERM GOALS: Target date: 02/24/2023    Pt will increase Foto to 59 to indicate improve function and reduced pain in LLE Baseline: 37 Goal status: INITIAL  2.  Pt will improve FGA to >19 to indicate reduced fall risk and improved safety with community mobility  Baseline:  Goal status: INITIAL  3.  Pt will increase 6 min walk test to >1529ft to indicate improved community access and reduced disability  Baseline:  Goal status: REVISED  4.  Pt will increase L ankle DF to at least 4+/5 to allow improved functional with work tasks and returned to PLOF Baseline:  Goal status: INITIAL    PLAN:  PT FREQUENCY: 1-2x/week  PT DURATION: 8 weeks  PLANNED INTERVENTIONS: Therapeutic exercises, Therapeutic activity, Neuromuscular re-education, Balance training, Gait training, Patient/Family education, Self Care, Joint mobilization, Joint  manipulation, Stair training, DME instructions, Dry Needling, Moist heat, and Manual therapy  PLAN FOR NEXT SESSION:   Re-assess progress towards goals and assess need to continue PT. Continue LLE ankle, ankle  ROM as well as hip/knee/ankle strength and increase ankle stability exercises in standing.     Golden Pop PT ,DPT Physical Therapist- [place]  [facility]   10:12 AM 02/03/23

## 2023-02-05 ENCOUNTER — Other Ambulatory Visit: Payer: Self-pay | Admitting: Orthopaedic Surgery

## 2023-02-05 DIAGNOSIS — M79672 Pain in left foot: Secondary | ICD-10-CM

## 2023-02-08 ENCOUNTER — Ambulatory Visit
Admission: RE | Admit: 2023-02-08 | Discharge: 2023-02-08 | Disposition: A | Discharge status: Home / Self Care | Payer: PRIVATE HEALTH INSURANCE | Source: Ambulatory Visit | Attending: Orthopaedic Surgery | Admitting: Orthopaedic Surgery

## 2023-02-08 ENCOUNTER — Other Ambulatory Visit: Payer: Self-pay | Admitting: Orthopaedic Surgery

## 2023-02-08 DIAGNOSIS — M79672 Pain in left foot: Secondary | ICD-10-CM | POA: Insufficient documentation

## 2023-02-08 MED ORDER — GADOBUTROL 1 MMOL/ML IV SOLN
8.0000 mL | Freq: Once | INTRAVENOUS | Status: AC | PRN
  Administered 2023-02-08: 8 mL via INTRAVENOUS

## 2023-02-09 ENCOUNTER — Ambulatory Visit: Payer: Worker's Compensation | Admitting: Physical Therapy

## 2023-02-09 ENCOUNTER — Other Ambulatory Visit: Payer: Self-pay | Admitting: Orthopaedic Surgery

## 2023-02-09 DIAGNOSIS — M25572 Pain in left ankle and joints of left foot: Secondary | ICD-10-CM | POA: Diagnosis not present

## 2023-02-09 DIAGNOSIS — R262 Difficulty in walking, not elsewhere classified: Secondary | ICD-10-CM

## 2023-02-09 DIAGNOSIS — M79672 Pain in left foot: Secondary | ICD-10-CM

## 2023-02-09 DIAGNOSIS — M6281 Muscle weakness (generalized): Secondary | ICD-10-CM

## 2023-02-09 NOTE — Therapy (Signed)
OUTPATIENT PHYSICAL THERAPY LOWER EXTREMITY Treatment   Patient Name: Stacy Joseph MRN: [id number] DOB:[birth year], 32 y.o., female Today's Date: 02/09/2023  END OF SESSION:  PT End of Session - 02/09/23 0758     Visit Number 9    Number of Visits 12    Date for PT Re-Evaluation 02/24/23    Progress Note Due on Visit 10    PT Start Time 0800    PT Stop Time 0847    PT Time Calculation (min) 47 min    Activity Tolerance Patient tolerated treatment well    Behavior During Therapy [facility] for tasks assessed/performed               Past Medical History:  Diagnosis Date   Allergy    Depression    Past Surgical History:  Procedure Laterality Date   TONSILLECTOMY AND ADENOIDECTOMY     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   SUBJECTIVE:   SUBJECTIVE STATEMENT: Pt reports that she is doing well. Reports some discomfort while walking into rehab department in the L ankle, as well as stiffness from having LLE positioned in MRI for >1 hour on the previous evening.     PERTINENT HISTORY: Pt Working at [employer] center for [employer]. On 11/03/23, Pt was getting totes off pallets, and pallet board broke causing L ankle roll andpain in R knee and L ankle. Pt has mild  baseline scoliosis  PAIN:  Are you having pain? Yes: NPRS scale: 2/10 Pain location: L ankle  Pain description: sharp  Aggravating factors: standing for long periods for time Relieving factors: taking weight off ankle   PRECAUTIONS: None Latex Allergies   WEIGHT BEARING RESTRICTIONS: No  FALLS:  Has patient fallen in last 6 months? Yes. Number of falls 1 at time of injury   LIVING ENVIRONMENT: Lives with: lives with their family Lives in: House/apartment Stairs: No Has following equipment at home: Single point cane has not used since first week since injury   OCCUPATION: Works in [employer]   PLOF: Independent  PATIENT GOALS: be able to lift without pain and perform work tasks without pain.   NEXT MD VISIT: 02/05/23  OBJECTIVE:   DIAGNOSTIC FINDINGS: EXAM: LEFT ANKLE COMPLETE - 3+ VIEW   COMPARISON:  None Available.   FINDINGS: There is no evidence of fracture, dislocation, or joint effusion. There is no evidence of arthropathy or other focal bone abnormality. Soft tissues are unremarkable.   IMPRESSION:  Negative.  PATIENT SURVEYS:  FOTO 37   COGNITION: Overall cognitive status: Within functional limits for tasks assessed     SENSATION: WFL  EDEMA:   mild palpable edema over Cubiod.      POSTURE: rounded shoulders, forward head, and increased thoracic kyphosis mild lumbothoracic scoliosis   PALPATION: Noted edema  in the lateral dorsum on the L foot.   LOWER EXTREMITY ROM:  Active ROM Right eval Left eval  Hip flexion [facility]  Hip extension [facility]   Hip abduction WFL   Hip adduction WFL   Hip internal rotation [facility]   Hip external rotation [facility]   Knee flexion [facility] WFL  Knee extension [facility] WFL  Ankle dorsiflexion 104 98(pain)  Ankle plantarflexion 153 130  Ankle inversion 30 20  Ankle eversion 40 40   (Blank rows = not tested)  LOWER EXTREMITY MMT:  MMT Right eval Left eval  Hip flexion 4+/5  4+/5   Hip extension 5/5 5/5  Hip abduction  4+/5  4+/5   Hip adduction 4+/5  4+/5   Hip internal rotation    Hip external rotation    Knee flexion 4+/5  5/5   Knee extension 4+/5 5/5  Ankle dorsiflexion 5/5 4-/5  Ankle plantarflexion 5/5 4-/5  Ankle inversion 5/5 4-/5  Ankle eversion 5/5 4-/5   (Blank rows = not tested)  LOWER EXTREMITY SPECIAL TESTS:  Ankle special tests: Tibial torsion test: positive , Anterior drawer test: positive , Talar tilt test: positive , and Great toe extension test: negative  FUNCTIONAL TESTS:  5 times sit to stand: 15.45, 14  Timed up and go (TUG): 10.8,   6 minute walk test: 1116,   10 meter walk test: 10.2,  Berg Balance Scale: 52,  Functional gait assessment: 18 SLS: RLE: 19.74sec LLE: 3.95sec   [facility] PT Assessment - 02/09/23 0001       Functional Gait  Assessment   Gait Level Surface Walks 20 ft in less than 5.5 sec, no assistive devices, good speed, no evidence for imbalance, normal gait pattern, deviates no more than 6 in outside of the 12 in walkway width.    Change in Gait Speed Able to smoothly change walking speed without loss of balance or gait deviation. Deviate no more than 6 in outside of the 12 in walkway width.    Gait with Horizontal Head Turns Performs head turns smoothly with no change in gait. Deviates no more than 6 in outside 12 in walkway width    Gait with Vertical Head Turns Performs head turns with no change in gait. Deviates no more than 6 in outside 12 in walkway width.    Gait and Pivot Turn Pivot turns safely within 3 sec and stops quickly with no loss of balance.    Step Over Obstacle Is able to step over one shoe box (4.5 in total height) without changing gait speed. No evidence of imbalance.    Gait with Narrow Base of Support Is able to ambulate for 10 steps heel to toe with no staggering.    Gait with Eyes Closed Walks 20 ft, uses assistive device, slower speed, mild gait deviations, deviates 6-10 in outside 12 in walkway width. Ambulates 20 ft in less than 9 sec  but greater than 7 sec.    Ambulating Backwards Walks 20 ft, uses assistive device, slower speed, mild gait deviations, deviates 6-10 in outside 12 in walkway width.    Steps Two feet to a stair, must use rail.    Total Score 25             [facility] PT Assessment - 02/09/23 0001       Functional Gait  Assessment   Gait Level Surface Walks 20 ft in less than 5.5 sec, no assistive devices, good speed, no evidence for imbalance, normal  gait pattern, deviates no more than 6 in outside of the 12 in walkway width.    Change in Gait Speed Able to smoothly change walking speed without loss of balance or gait deviation. Deviate no more than 6 in outside of the 12 in walkway width.    Gait with Horizontal Head Turns Performs head turns smoothly with no change in gait. Deviates no more than 6 in outside 12 in walkway width    Gait with Vertical Head Turns Performs head turns with no change in gait. Deviates no more than 6 in outside 12 in walkway width.    Gait and Pivot Turn Pivot turns safely within 3 sec and stops quickly with no loss of balance.    Step Over Obstacle Is able to step over one shoe box (4.5 in total height) without changing gait speed. No evidence of imbalance.    Gait with Narrow Base of Support Is able to ambulate for 10 steps heel to toe with no staggering.    Gait with Eyes Closed Walks 20 ft, uses assistive device, slower speed, mild gait deviations, deviates 6-10 in outside 12 in walkway width. Ambulates 20 ft in less than 9 sec but greater than 7 sec.    Ambulating Backwards Walks 20 ft, uses assistive device, slower speed, mild gait deviations, deviates 6-10 in outside 12 in walkway width.    Steps Two feet to a stair, must use rail.    Total Score 25             GAIT: Distance walked: 42ft Assistive device utilized: None Level of assistance: Complete Independence Comments: antalgic on the LLE   TODAY'S TREATMENT:                                                                                                                               DATE: 02/09/2023   Nustep level AAROM for L ankle and R knee level 3 x 5 min. Noted to have increased ROM in the L ankle with increased time.   Sitting  Heel slide for increased ankle DF ROM x 15 Inversion/eversion ROM on pillow case x 20 each   Seated calf raise x 20  Seated toe raise x 20  Toe Yoga x 20 big toe and smaller toe alternating  Arch lifts x 15 - cues for correct muscle activation    Standing  Arch lifts x 15 - UE supported on rail at wall Reciprocal March with only intermittent UE support x 10BLE Forward foot tap on hedge hog standing on purple airex x 10  Lateral foot tap to hedge hog target x 10 bil  Partial squat with UE support on rails standing on airex pad AP rocker board control x 20 each Lateral rocker board small x 10 medium x 15 bil.   Pt performed 5 time sit<>stand (5xSTS): (17.5, 11.8) 14.6 sec (>15 sec indicates increased fall risk)   Patient demonstrates increased fall  risk as noted by score of 25/30 on  Functional Gait Assessment.   <22/30 = predictive of falls, <20/30 = fall in 6 months, <18/30 = predictive of falls in PD MCID: 5 points stroke population, 4 points geriatric population (ANPTA Core Set of Outcome Measures for Adults with Neurologic Conditions, 2018)  SLS no UE support >30 sec on the BLE with mild discomfort on the LLE.   PATIENT EDUCATION:  Education details: Pt educated on POC, rehab potential as well as  throughout session about proper posture and technique with exercises. Improved exercise technique, movement at target joints, use of target muscles after min to mod verbal, visual, tactile cues.  Person educated: Patient Education method: Medical illustrator Education comprehension: verbalized understanding  HOME EXERCISE PROGRAM: Access Code: [email or web address] URL: [email or web address] Date: 01/25/2023 Prepared by: Grier Rocher  Exercises - Seated Ankle Eversion with Resistance  - 1 x daily - 7 x weekly - 3 sets - 10 reps - Seated Ankle Inversion with Resistance  - 1 x daily - 7 x weekly - 3 sets - 10 reps - Seated Ankle Dorsiflexion with Resistance  - 1 x daily - 7 x weekly - 3 sets - 10 reps - Sitting Knee Extension with Resistance  - 1 x daily - 7 x weekly - 3 sets - 10 reps - Seated Hip Abduction with Resistance  - 1 x daily - 7 x weekly - 3 sets - 10 reps - Seated Hamstring Curl with Anchored Resistance  - 1 x daily - 7 x weekly - 3 sets - 10 reps  URL: [email or web address] Date: 01/13/2023 Prepared by: Grier Rocher  Exercises - Ankle Inversion Eversion Towel Slide  - 1 x daily - 7 x weekly - 3 sets - 10 reps - Towel Scrunches  - 1 x daily - 7 x weekly - 3 sets - 10 reps - Seated Heel Slide  - 1 x daily - 7 x weekly - 3 sets - 10 reps  ASSESSMENT:  CLINICAL IMPRESSION:  Pt put forth excellent effort in PT treatment on this day for all activities. Pt also noted to have continue swelling over the L cuboid. Increased challenge to reduce UE support for standing activies on purple airex pad, with greater success and reduce pain in the LLE. Noted to still have mild instability on the LLE compared to the L, but significant reduction need for UE support with only mild discomfort on this day.  Pt demonstrates improved function with increase in FGA for 25, decreased time in 5xSTS to 14.6sec and increased tolerance for SLS for >30 sec on BLE, but continues to have pain and instability in the L ankle with prolonged standing. Pt will continue to benefit from skilled PT to improve function, reduce pain and increase strength to allow return to PLOF and increased QoL.   OBJECTIVE IMPAIRMENTS: Abnormal gait, decreased activity tolerance, decreased balance, decreased knowledge of use of DME, decreased mobility, difficulty walking, decreased ROM, and decreased strength.   ACTIVITY LIMITATIONS: carrying,  bending, standing, squatting, stairs, and locomotion level  PARTICIPATION LIMITATIONS: driving, shopping, community activity, occupation, and yard work  PERSONAL FACTORS: Fitness, Past/current experiences, and Profession are also affecting patient's functional outcome.   REHAB POTENTIAL: Excellent  CLINICAL DECISION MAKING: Stable/uncomplicated  EVALUATION COMPLEXITY: Moderate   GOALS: Goals reviewed with patient? Yes  SHORT TERM GOALS: Target date: 02/10/2023   Pt will independent with HEP to improve strength, ROM and ankle stability and allow improved independence with ADLS and  reduce pain  Baseline: provided Goal status: INITIAL   LONG TERM GOALS: Target date: 02/24/2023    Pt will increase Foto to 59 to indicate improve function and reduced pain in LLE Baseline: 37 Goal status: INITIAL  2.  Pt will improve FGA to >19 to indicate reduced fall risk and improved safety with community mobility  Baseline: 18; 4/30: 25/30  Goal status: MET  3.  Pt will increase 6 min walk test to >15101ft to indicate improved community access and reduced disability  Baseline: 1116 Goal status: REVISED  4.  Pt will increase L ankle DF to at least 4+/5 to allow improved functional with work tasks and returned to PLOF Baseline: 4-/5 Goal status: INITIAL    PLAN:  PT FREQUENCY: 1-2x/week  PT DURATION: 8 weeks  PLANNED INTERVENTIONS: Therapeutic exercises, Therapeutic activity, Neuromuscular re-education, Balance training, Gait training, Patient/Family education, Self Care, Joint mobilization, Joint manipulation, Stair training, DME instructions, Dry Needling, Moist heat, and Manual therapy  PLAN FOR NEXT SESSION:   Continue to re-assess progress and increase dynamic load on L ankle with stability and functional strengthening.    Golden Pop PT ,DPT Physical Therapist- [facility]   9:06 AM 02/09/23

## 2023-02-25 ENCOUNTER — Ambulatory Visit: Payer: PRIVATE HEALTH INSURANCE | Attending: Orthopaedic Surgery | Admitting: Physical Therapy

## 2023-02-25 DIAGNOSIS — R262 Difficulty in walking, not elsewhere classified: Secondary | ICD-10-CM | POA: Insufficient documentation

## 2023-02-25 DIAGNOSIS — M25572 Pain in left ankle and joints of left foot: Secondary | ICD-10-CM | POA: Diagnosis present

## 2023-02-25 DIAGNOSIS — M6281 Muscle weakness (generalized): Secondary | ICD-10-CM | POA: Insufficient documentation

## 2023-02-25 NOTE — Therapy (Addendum)
OUTPATIENT PHYSICAL THERAPY LOWER EXTREMITY Treatment/ PHYSICAL THERAPY PROGRESS NOTE/ Re certification    Dates of reporting period  01/13/2023   to   02/25/2023     Patient Name: Stacy Joseph MRN: [id number] DOB:September 14, 1991, 32 y.o., female Today's Date: 02/25/2023  END OF SESSION:  PT End of Session - 02/25/23 0808     Visit Number 10    Number of Visits 20    Date for PT Re-Evaluation 04/22/2023    Progress Note Due on Visit 20    PT Start Time 0804    PT Stop Time 0845    PT Time Calculation (min) 41 min    Activity Tolerance Patient tolerated treatment well    Behavior During Therapy [facility] for tasks assessed/performed               Past Medical History:  Diagnosis Date   Allergy    Depression    Past Surgical History:  Procedure Laterality Date   TONSILLECTOMY AND ADENOIDECTOMY     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   SUBJECTIVE:   SUBJECTIVE STATEMENT: Pt reports that she is doing well. No pain at start of PT session. Mild discomfort  with end range DF on the LLE . Pt reports that since her last PT session on 4/30, she has been able to continue her HEP most days. Pt has been approved for 8 more PT visits from Workers Compensation   PERTINENT  HISTORY: Pt Working at [employer] center for [employer]. On 11/03/23, Pt was getting totes off pallets, and pallet board broke causing L ankle roll andpain in R knee and L ankle. Pt has mild baseline scoliosis  PAIN:  Are you having pain? Yes: NPRS scale: 2/10 Pain location: L ankle  Pain description: sharp  Aggravating factors: standing for long periods for time Relieving factors: taking weight off ankle   PRECAUTIONS: None Latex Allergies   WEIGHT BEARING RESTRICTIONS: No  FALLS:  Has patient fallen in last 6 months? Yes. Number of falls 1 at time of injury   LIVING ENVIRONMENT: Lives with: lives with their family Lives in: House/apartment Stairs: No Has following equipment at home: Single point cane has not used since first week since injury   OCCUPATION: Works in [employer]   PLOF: Independent  PATIENT GOALS: be able to lift without pain and perform work tasks without pain.   NEXT MD VISIT: 02/05/23  OBJECTIVE:   DIAGNOSTIC FINDINGS: EXAM: LEFT ANKLE COMPLETE - 3+ VIEW   COMPARISON:  None Available.   FINDINGS: There is no evidence of fracture, dislocation, or joint effusion. There is no evidence of arthropathy or other focal bone abnormality. Soft tissues are unremarkable.   IMPRESSION:  Negative.  PATIENT SURVEYS:  FOTO 37   COGNITION: Overall cognitive status: Within  functional limits for tasks assessed     SENSATION: WFL  EDEMA:   mild palpable edema over Cubiod.      POSTURE: rounded shoulders, forward head, and increased thoracic kyphosis mild lumbothoracic scoliosis   PALPATION: Noted edema in the lateral dorsum on the L foot.   LOWER EXTREMITY ROM:  Active ROM Right eval Left eval  Hip flexion [facility]  Hip extension [facility]   Hip abduction WFL   Hip adduction WFL   Hip internal rotation [facility]   Hip external rotation [facility]   Knee flexion [facility] WFL  Knee extension [facility] WFL  Ankle dorsiflexion 104 98(pain)  Ankle plantarflexion 153  130  Ankle inversion 30 20  Ankle eversion 40 40   (Blank rows = not tested)  LOWER EXTREMITY MMT:  MMT Right eval Left eval  Hip flexion 4+/5  4+/5   Hip extension 5/5 5/5  Hip abduction 4+/5  4+/5   Hip adduction 4+/5  4+/5   Hip internal rotation    Hip external rotation    Knee flexion 4+/5  5/5   Knee extension 4+/5 5/5  Ankle dorsiflexion 5/5 4-/5  Ankle plantarflexion 5/5 4-/5  Ankle inversion 5/5 4-/5  Ankle eversion 5/5 4-/5   (Blank rows = not tested)  LOWER EXTREMITY SPECIAL TESTS:  Ankle special tests: Tibial torsion test: positive , Anterior drawer test: positive , Talar tilt test: positive , and Great toe extension test: negative  FUNCTIONAL TESTS:  5 times sit to stand: 15.45, 14  Timed up and go (TUG): 10.8,   6 minute walk test: 1116,   10 meter walk test: 10.2,  Berg Balance Scale: 52,  Functional gait assessment: 18 SLS: RLE: 19.74sec LLE: 3.95sec       GAIT: Distance walked: 25ft Assistive device utilized: None Level of assistance: Complete Independence Comments: antalgic on the LLE   TODAY'S TREATMENT:                                                                                                                              DATE: 02/25/2023   Nustep level AAROM for L ankle and R knee level 3 x3.87min level 1 x 1.57min. Noted to have increased ROM in the L ankle with increased time.   6 Min Walk Test:  Instructed patient to ambulate as quickly and as safely as possible for 6 minutes using LRAD. Patient was allowed to take standing rest breaks without stopping the test, but if the patient required a sitting rest break the clock would be stopped and the test would be over.  Results: 1298 feet (395 meters), using no AD with out assist. Results indicate that the patient has reduced endurance with ambulation compared to age matched norms.  Age Matched Norms: 8-69 yo M: 15 F: 40, 25-79 yo M: 49 F: 471, 41-89 yo M: 417 F: 392 MDC: 58.21 meters  (190.98 feet) or 50 meters (ANPTA Core Set of Outcome Measures  for Adults with Neurologic Conditions, 2018) Mild heaviness reported by pt, but no pain at end of 6 min.   Seated: BLE on airex pad. Toe splaying x 20  Arch lift x 20 Seated inversion/eversion x 20 bil  Ankle DF/PF with heal on pad x 25  HS curl RTB x 12  LAQ x 12 GTB Hip abduction x 12 GTB  Standing on airex pad  Arch lift x 12  Toe yoga x 12  Seated: heel slide x 15 LLE only.      PATIENT EDUCATION:  Education details: Pt educated on POC, rehab potential as well as  throughout session about proper posture and technique with exercises. Improved exercise technique, movement at target joints, use of target muscles after min to mod verbal, visual, tactile cues.  Person educated: Patient Education method: Medical illustrator Education comprehension: verbalized understanding  HOME EXERCISE PROGRAM: Access Code: [email or web address] URL: [email or web address] Date: 01/25/2023 Prepared by: Grier Rocher  Exercises - Seated Ankle Eversion with Resistance  - 1 x daily - 7 x weekly - 3 sets - 10 reps - Seated Ankle Inversion with Resistance  - 1 x daily - 7 x weekly - 3 sets - 10 reps - Seated Ankle Dorsiflexion with Resistance  - 1 x daily - 7 x weekly - 3 sets - 10 reps - Sitting Knee Extension with Resistance  - 1 x daily - 7 x weekly - 3 sets - 10 reps - Seated Hip Abduction with Resistance  - 1 x daily - 7 x weekly - 3 sets - 10 reps - Seated Hamstring Curl with Anchored Resistance  - 1 x daily - 7 x weekly - 3 sets - 10 reps  URL: [email or web address] Date: 01/13/2023 Prepared by: Grier Rocher  Exercises - Ankle Inversion Eversion Towel Slide  - 1 x daily - 7 x weekly - 3 sets - 10 reps - Towel Scrunches  - 1 x daily - 7 x weekly - 3 sets - 10 reps - Seated Heel Slide  - 1 x daily - 7 x weekly - 3 sets - 10 reps  ASSESSMENT:  CLINICAL IMPRESSION:  Pt put forth excellent  effort in PT treatment on this day for all therapeutic exercise. Pt demonstrates improved AROM in L ankle. Increased 6 min walk test. And decreaesd pain and stiffness with therex compared to prior session.  Pt will continue to benefit from skilled PT to improve function, reduce pain and increase strength to allow return to PLOF and increased QoL.   OBJECTIVE IMPAIRMENTS: Abnormal gait, decreased activity tolerance, decreased balance, decreased knowledge of use of DME, decreased mobility, difficulty walking, decreased ROM, and decreased strength.   ACTIVITY LIMITATIONS: carrying, bending, standing, squatting, stairs, and locomotion level  PARTICIPATION LIMITATIONS: driving, shopping, community activity, occupation, and yard work  PERSONAL FACTORS: Fitness, Past/current experiences, and Profession are also affecting patient's functional outcome.   REHAB POTENTIAL: Excellent  CLINICAL DECISION MAKING: Stable/uncomplicated  EVALUATION COMPLEXITY: Moderate   GOALS: Goals reviewed with patient? Yes  SHORT TERM GOALS: Target date: 02/10/2023   Pt will independent with HEP to improve strength, ROM and ankle stability and allow improved independence with ADLS and reduce pain  Baseline: provided Goal status: INITIAL   LONG TERM GOALS: Target date: 02/24/2023    Pt will increase Foto to 59 to indicate improve function and reduced pain in LLE Baseline: 37. 5/16: 59 Goal status: MET  2.  Pt will improve FGA to >19 to indicate reduced fall risk  and improved safety with community mobility  Baseline: 18; 4/30: 25/30  Goal status: MET  3.  Pt will increase 6 min walk test to >1517ft to indicate improved community access and reduced disability  Baseline: 1116.; 5/16: 1298 feet  Goal status: REVISED  4.  Pt will increase L ankle DF to at least 4+/5 to allow improved functional with work tasks and returned to PLOF Baseline: 4-/5 Goal status: INITIAL    PLAN:  PT FREQUENCY:  1-2x/week  PT DURATION: 8 weeks  PLANNED INTERVENTIONS: Therapeutic exercises, Therapeutic activity, Neuromuscular re-education, Balance training, Gait training, Patient/Family education, Self Care, Joint mobilization, Joint manipulation, Stair training, DME instructions, Dry Needling, Moist heat, and Manual therapy  PLAN FOR NEXT SESSION:   Continue to re-assess progress and increase dynamic load on L ankle with stability and functional strengthening.    Golden Pop PT ,DPT Physical Therapist- [facility]   9:03 AM 02/25/23

## 2023-03-02 ENCOUNTER — Ambulatory Visit: Payer: PRIVATE HEALTH INSURANCE | Admitting: Physical Therapy

## 2023-03-02 DIAGNOSIS — R262 Difficulty in walking, not elsewhere classified: Secondary | ICD-10-CM

## 2023-03-02 DIAGNOSIS — M6281 Muscle weakness (generalized): Secondary | ICD-10-CM

## 2023-03-02 DIAGNOSIS — M25572 Pain in left ankle and joints of left foot: Secondary | ICD-10-CM

## 2023-03-02 NOTE — Therapy (Addendum)
OUTPATIENT PHYSICAL THERAPY LOWER EXTREMITY Treatment/      Patient Name: Stacy Joseph MRN: [id number] DOB:1991-09-23, 32 y.o., female Today's Date: 03/02/2023  END OF SESSION:  PT End of Session - 03/02/23 0757     Visit Number 11    Number of Visits 20    Date for PT Re-Evaluation 04/22/2023    Progress Note Due on Visit 20    PT Start Time 0802    PT Stop Time 0842    PT Time Calculation (min) 40 min    Activity Tolerance Patient tolerated treatment well    Behavior During Therapy [facility] for tasks assessed/performed               Past Medical History:  Diagnosis Date   Allergy    Depression    Past Surgical History:  Procedure Laterality Date   TONSILLECTOMY AND ADENOIDECTOMY     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   SUBJECTIVE:   SUBJECTIVE STATEMENT: Pt reports that she is doing well. No pain at start of PT session. States that she had mild soreness in L ankle and R knee yesterday due to highly active day at father's birthday party, but today has been a good day.    PERTINENT HISTORY: Pt Working at [employer] center for [employer]. On 11/03/23, Pt was getting totes off pallets, and pallet board broke causing L ankle roll andpain in R knee and L  ankle. Pt has mild baseline scoliosis  PAIN:  Are you having pain? Yes: NPRS scale: 2/10 Pain location: L ankle  Pain description: sharp  Aggravating factors: standing for long periods for time Relieving factors: taking weight off ankle   PRECAUTIONS: None Latex Allergies   WEIGHT BEARING RESTRICTIONS: No  FALLS:  Has patient fallen in last 6 months? Yes. Number of falls 1 at time of injury   LIVING ENVIRONMENT: Lives with: lives with their family Lives in: House/apartment Stairs: No Has following equipment at home: Single point cane has not used since first week since injury   OCCUPATION: Works in [employer]   PLOF: Independent  PATIENT GOALS: be able to lift without pain and perform work tasks without pain.   NEXT MD VISIT: 02/05/23  OBJECTIVE:  takes on Evaluation unless otherwise noted.   DIAGNOSTIC FINDINGS:   02/08/2023 EXAM: MRI OF THE LEFT FOOT WITHOUT AND WITH CONTRAST IMPRESSION: Torn distal calcaneofibular ligament with adjacent thin soft tissue enhancement and along the lateral aspect of the calcaneus deep to the peroneal tendons, favored to reflect focal inflammatory change/enhancing fibrous tissue related to prior injury. Intact but thickened anterior talofibular ligament, compatible with chronic sprain.   Focal subchondral marrow edema along the lateral talar dome with mild irregularity of the adjacent subchondral bone plate and overlying cartilage, suspicious for a  small low-grade osteochondral injury. No evidence of unstable fragment.   2.8 x 1.1 x 1.4 cm ganglion cyst along the dorsal lateral hindfoot within the extensor digitorum brevis origin.   11/27/2022 EXAM: LEFT ANKLE COMPLETE - 3+ VIEW   COMPARISON:  None Available.   FINDINGS: There is no evidence of fracture, dislocation, or joint effusion. There is no evidence of arthropathy or other focal bone abnormality. Soft tissues are unremarkable.   IMPRESSION:  Negative.  PATIENT  SURVEYS:  FOTO 37   COGNITION: Overall cognitive status: Within functional limits for tasks assessed     SENSATION: WFL  EDEMA:   mild palpable edema over Cubiod.      POSTURE: rounded shoulders, forward head, and increased thoracic kyphosis mild lumbothoracic scoliosis   PALPATION: Noted edema in the lateral dorsum on the L foot.   LOWER EXTREMITY ROM:  Active ROM Right eval Left eval  Hip flexion [facility]  Hip extension [facility]   Hip abduction WFL   Hip adduction WFL   Hip internal rotation [facility]   Hip external rotation [facility]   Knee flexion [facility] WFL  Knee extension [facility] WFL  Ankle dorsiflexion 104 98(pain)  Ankle plantarflexion 153 130  Ankle inversion 30 20  Ankle eversion 40 40   (Blank rows = not tested)  LOWER EXTREMITY MMT:  MMT Right eval Left eval  Hip flexion 4+/5  4+/5   Hip extension 5/5 5/5  Hip abduction 4+/5  4+/5   Hip adduction 4+/5  4+/5   Hip internal rotation    Hip external rotation    Knee flexion 4+/5  5/5   Knee extension 4+/5 5/5  Ankle dorsiflexion 5/5 4-/5  Ankle plantarflexion 5/5 4-/5  Ankle inversion 5/5 4-/5  Ankle eversion 5/5 4-/5   (Blank rows = not tested)  LOWER EXTREMITY SPECIAL TESTS:  Ankle special tests: Tibial torsion test: positive , Anterior drawer test: positive , Talar tilt test: positive , and Great toe extension test: negative  FUNCTIONAL TESTS:  5 times sit to stand: 15.45, 14  Timed up and go (TUG): 10.8,   6 minute walk test: 1116,   10 meter walk test: 10.2,  Berg Balance Scale: 52,  Functional gait assessment: 18 SLS: RLE: 19.74sec LLE: 3.95sec       GAIT: Distance walked: 39ft Assistive device utilized: None Level of assistance: Complete Independence Comments: antalgic on the LLE   TODAY'S TREATMENT:                                                                                                                              DATE: 03/02/2023   Nustep level AAROM for L ankle and R knee level 1  x4 min.  Noted to have increased ROM in the L ankle with increased time.   Seated: BLE on airex pad. Toe splaying x 20  Arch lift x 20 Ankle DF/PF with heal on pad x 25  Hip abduction x  12 GTB Hip flexion x 12 GTB   Toe yoga x 20   Rocker board with UE support as needed at rail on wall:  AP control squat with UE support 2x 12  AP rock with intermittent UE support 2x 15  Lateral control squat 2x 12 with UE support  Lateral rock R and L 2x 15 bil with UE support    Standing at stair rail  Terminal knee extension GTB x 12 bil  Hip extension x 12 bil GTB    Seated: heel slide x 15 BLE only.   Throughout session pt monitored for change in status or increased pain in affected BLE, required a multiple therapeutic rest breaks due to R knee or L ankle soreness following standing therex.  Pt reports mild muscle soreness in BLE    PATIENT EDUCATION:  Education details: Pt educated on POC, rehab potential as well as  throughout session about proper posture and technique with exercises. Improved exercise technique, movement at target joints, use of target muscles after min to mod verbal, visual, tactile cues.  Person educated: Patient Education method: Medical illustrator Education comprehension: verbalized understanding  HOME EXERCISE PROGRAM: Access Code: [email or web address] URL: [email or web address] Date: 01/25/2023 Prepared by: Grier Rocher  Exercises - Seated Ankle Eversion with Resistance  - 1 x daily - 7 x weekly - 3 sets - 10 reps - Seated Ankle Inversion with Resistance  - 1 x daily - 7 x weekly - 3 sets - 10 reps - Seated Ankle Dorsiflexion with Resistance  - 1 x daily - 7 x weekly - 3 sets - 10 reps - Sitting Knee Extension with Resistance  - 1 x daily - 7 x weekly - 3 sets - 10 reps - Seated Hip Abduction with Resistance  - 1 x daily - 7 x weekly - 3 sets - 10 reps - Seated Hamstring Curl with Anchored Resistance  - 1 x daily - 7 x weekly - 3 sets - 10  reps  URL: [email or web address] Date: 01/13/2023 Prepared by: Grier Rocher  Exercises - Ankle Inversion Eversion Towel Slide  - 1 x daily - 7 x weekly - 3 sets - 10 reps - Towel Scrunches  - 1 x daily - 7 x weekly - 3 sets - 10 reps - Seated Heel Slide  - 1 x daily - 7 x weekly - 3 sets - 10 reps  ASSESSMENT:  CLINICAL IMPRESSION:  Pt put forth excellent effort in PT treatment on this day for all therapeutic exercise. Pt able to tolerate increased therex in WB through BLE to target ankle stabilizers and improve functional use of LLE.  Pt will continue to benefit from skilled PT to improve function, reduce pain and increase strength to allow return to PLOF and increased QoL.   OBJECTIVE IMPAIRMENTS: Abnormal gait, decreased activity tolerance, decreased balance, decreased knowledge of use of DME, decreased mobility, difficulty walking, decreased ROM, and decreased strength.   ACTIVITY LIMITATIONS: carrying, bending, standing, squatting, stairs, and locomotion level  PARTICIPATION LIMITATIONS: driving, shopping, community activity, occupation, and yard work  PERSONAL FACTORS: Fitness, Past/current experiences, and Profession are also affecting patient's functional outcome.   REHAB POTENTIAL: Excellent  CLINICAL DECISION MAKING: Stable/uncomplicated  EVALUATION COMPLEXITY: Moderate   GOALS: Goals reviewed with patient? Yes  SHORT TERM GOALS: Target date: 02/10/2023   Pt will independent with HEP to improve strength, ROM and ankle stability and allow improved independence with ADLS and reduce pain  Baseline: provided Goal status: INITIAL  LONG TERM GOALS: Target date: 02/24/2023    Pt will increase Foto to 59 to indicate improve function and reduced pain in LLE Baseline: 37. 5/16: 59 Goal status: MET  2.  Pt will improve FGA to >19 to indicate reduced fall risk and improved safety with community mobility  Baseline: 18; 4/30: 25/30  Goal status: MET  3.   Pt will increase 6 min walk test to >1519ft to indicate improved community access and reduced disability  Baseline: 1116.; 5/16: 1298 feet  Goal status: REVISED  4.  Pt will increase L ankle DF to at least 4+/5 to allow improved functional with work tasks and returned to PLOF Baseline: 4-/5 Goal status: INITIAL    PLAN:  PT FREQUENCY: 1-2x/week  PT DURATION: 8 weeks  PLANNED INTERVENTIONS: Therapeutic exercises, Therapeutic activity, Neuromuscular re-education, Balance training, Gait training, Patient/Family education, Self Care, Joint mobilization, Joint manipulation, Stair training, DME instructions, Dry Needling, Moist heat, and Manual therapy  PLAN FOR NEXT SESSION:   Continue to re-assess progress and increase dynamic load on L ankle with stability and functional strengthening.    Golden Pop PT ,DPT Physical Therapist- [place]  [facility]   8:43 AM 03/02/23

## 2023-03-04 ENCOUNTER — Ambulatory Visit: Payer: PRIVATE HEALTH INSURANCE | Admitting: Physical Therapy

## 2023-03-04 DIAGNOSIS — M25572 Pain in left ankle and joints of left foot: Secondary | ICD-10-CM

## 2023-03-04 DIAGNOSIS — M6281 Muscle weakness (generalized): Secondary | ICD-10-CM | POA: Diagnosis not present

## 2023-03-04 DIAGNOSIS — R262 Difficulty in walking, not elsewhere classified: Secondary | ICD-10-CM

## 2023-03-04 NOTE — Therapy (Addendum)
OUTPATIENT PHYSICAL THERAPY LOWER EXTREMITY Treatment/     Patient Name: Stacy Joseph MRN: [id number] DOB:01/21/1991, 32 y.o., female Today's Date: 03/04/2023  END OF SESSION:  PT End of Session - 03/04/23 0800     Visit Number 12    Number of Visits 20    Date for PT Re-Evaluation 04/22/2023   Progress Note Due on Visit 20    PT Start Time 0801    PT Stop Time 0845    PT Time Calculation (min) 44 min    Activity Tolerance Patient tolerated treatment well    Behavior During Therapy [facility] for tasks assessed/performed               Past Medical History:  Diagnosis Date   Allergy    Depression    Past Surgical History:  Procedure Laterality Date   TONSILLECTOMY AND ADENOIDECTOMY     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   SUBJECTIVE:   SUBJECTIVE STATEMENT: Pt reports that she is doing well. No pain at start of PT session. States that she had mild soreness in L ankle and R knee yesterday due to highly active day at father's birthday party, but today has been a good day.    PERTINENT HISTORY: Pt Working at [employer] for [employer]. On 11/03/23, Pt was getting totes off pallets, and pallet board broke causing L ankle roll andpain in R knee and L  ankle. Pt has mild baseline scoliosis  PAIN:  Are you having pain? Yes: NPRS scale: 2/10 Pain location: L ankle  Pain description: sharp  Aggravating factors: standing for long periods for time Relieving factors: taking weight off ankle   PRECAUTIONS: None Latex Allergies   WEIGHT BEARING RESTRICTIONS: No  FALLS:  Has patient fallen in last 6 months? Yes. Number of falls 1 at time of injury   LIVING ENVIRONMENT: Lives with: lives with their family Lives in: House/apartment Stairs: No Has following equipment at home: Single point cane has not used since first week since injury   OCCUPATION: Works in [employer]   PLOF: Independent  PATIENT GOALS: be able to lift without pain and perform work tasks without pain.   NEXT MD VISIT: 02/05/23  OBJECTIVE:  takes on Evaluation unless otherwise noted.   DIAGNOSTIC FINDINGS:   02/08/2023 EXAM: MRI OF THE LEFT FOOT WITHOUT AND WITH CONTRAST IMPRESSION: Torn distal calcaneofibular ligament with adjacent thin soft tissue enhancement and along the lateral aspect of the calcaneus deep to the peroneal tendons, favored to reflect focal inflammatory change/enhancing fibrous tissue related to prior injury. Intact but thickened anterior talofibular ligament, compatible with chronic sprain.   Focal subchondral marrow edema along the lateral talar dome with mild irregularity of the adjacent subchondral bone plate and overlying cartilage, suspicious for a small low-grade  osteochondral injury. No evidence of unstable fragment.   2.8 x 1.1 x 1.4 cm ganglion cyst along the dorsal lateral hindfoot within the extensor digitorum brevis origin.   11/27/2022 EXAM: LEFT ANKLE COMPLETE - 3+ VIEW   COMPARISON:  None Available.   FINDINGS: There is no evidence of fracture, dislocation, or joint effusion. There is no evidence of arthropathy or other focal bone abnormality. Soft tissues are unremarkable.   IMPRESSION:  Negative.  PATIENT  SURVEYS:  FOTO 37   COGNITION: Overall cognitive status: Within functional limits for tasks assessed     SENSATION: WFL  EDEMA:   mild palpable edema over Cubiod.      POSTURE: rounded shoulders, forward head, and increased thoracic kyphosis mild lumbothoracic scoliosis   PALPATION: Noted edema in the lateral dorsum on the L foot.   LOWER EXTREMITY ROM:  Active ROM Right eval Left eval  Hip flexion [facility]  Hip extension [facility]   Hip abduction WFL   Hip adduction WFL   Hip internal rotation [facility]   Hip external rotation [facility]   Knee flexion [facility] WFL  Knee extension [facility] WFL  Ankle dorsiflexion 104 98(pain)  Ankle plantarflexion 153 130  Ankle inversion 30 20  Ankle eversion 40 40   (Blank rows = not tested)  LOWER EXTREMITY MMT:  MMT Right eval Left eval  Hip flexion 4+/5  4+/5   Hip extension 5/5 5/5  Hip abduction 4+/5  4+/5   Hip adduction 4+/5  4+/5   Hip internal rotation    Hip external rotation    Knee flexion 4+/5  5/5   Knee extension 4+/5 5/5  Ankle dorsiflexion 5/5 4-/5  Ankle plantarflexion 5/5 4-/5  Ankle inversion 5/5 4-/5  Ankle eversion 5/5 4-/5   (Blank rows = not tested)  LOWER EXTREMITY SPECIAL TESTS:  Ankle special tests: Tibial torsion test: positive , Anterior drawer test: positive , Talar tilt test: positive , and Great toe extension test: negative  FUNCTIONAL TESTS:  5 times sit to stand: 15.45, 14  Timed up and go (TUG): 10.8,   6 minute walk test: 1116,   10 meter walk test: 10.2,  Berg Balance Scale: 52,  Functional gait assessment: 18 SLS: RLE: 19.74sec LLE: 3.95sec       GAIT: Distance walked: 31ft Assistive device utilized: None Level of assistance: Complete Independence Comments: antalgic on the LLE   TODAY'S TREATMENT:                                                                                                                              DATE: 03/04/2023   Nustep level AAROM for L ankle and R knee level 2  x4 min BLE only. Noted to have increased ROM in the L ankle with increased time.   Seated:  LAQ x 10 BLE, RTB HS curl x 15 BLE, RTB Hip abduction x 12 RTB BLE Hip flexion RTB x 10 bil  LE  push down x 12 bil RTB,  Heel slide x 20 bil   Heel raise/toe raise x 20 bil  Rocker board A/P control x 20  Rocker board lateral control x 20   Standing BLE on airex pad. Toe splaying x 20  Foot tap to target on 4inch airex pad x10 bil  Standing on level ground;  Forward lunge with front leg on 4inch step.     Throughout session pt monitored for change in status or increased pain in affected BLE, required a multiple therapeutic rest breaks due to R knee or L ankle soreness following standing therex. Resolved with short therapeutic rest break.     PATIENT EDUCATION:  Education details: Pt educated on POC, rehab potential as well as  throughout session about proper posture and technique with exercises. Improved exercise technique, movement at target joints, use of target muscles after min to mod verbal, visual, tactile cues.  Person educated: Patient Education method: Medical illustrator Education comprehension: verbalized understanding  HOME EXERCISE PROGRAM: Access Code: [email or web address] URL: [email or web address] Date: 01/25/2023 Prepared by: Grier Rocher  Exercises - Seated Ankle Eversion with Resistance  - 1 x daily - 7 x weekly - 3 sets - 10 reps - Seated Ankle Inversion with Resistance  - 1 x daily - 7 x weekly - 3 sets - 10 reps - Seated Ankle Dorsiflexion with Resistance  - 1 x daily - 7 x weekly - 3 sets - 10 reps - Sitting Knee Extension with Resistance  - 1 x daily - 7 x weekly - 3 sets - 10 reps - Seated Hip Abduction with Resistance  - 1 x daily - 7 x weekly - 3 sets - 10 reps - Seated Hamstring Curl with Anchored Resistance  - 1 x daily - 7 x weekly - 3 sets - 10 reps  URL: [email or web address] Date: 01/13/2023 Prepared by: Grier Rocher  Exercises - Ankle Inversion Eversion Towel Slide  - 1 x daily - 7 x weekly - 3 sets - 10 reps - Towel Scrunches  - 1 x daily - 7 x weekly - 3 sets - 10 reps - Seated Heel Slide  - 1 x daily - 7 x weekly - 3 sets - 10 reps  ASSESSMENT:  CLINICAL IMPRESSION:  Pt put forth excellent effort in PT treatment on this day for all therapeutic exercise. Pt reports increased soreness in ankle and R knee compared to prior session. Session focused on BLE strengthening and ankle ROM due to pt's pain limitations on this day. Pt will continue to benefit from skilled PT to improve function, reduce pain and increase strength to allow return to PLOF and increased QoL.   OBJECTIVE IMPAIRMENTS: Abnormal gait, decreased activity tolerance, decreased balance, decreased knowledge of use of DME, decreased mobility, difficulty walking, decreased ROM, and decreased strength.   ACTIVITY LIMITATIONS: carrying, bending, standing, squatting, stairs, and locomotion level  PARTICIPATION LIMITATIONS: driving, shopping, community activity, occupation, and yard work  PERSONAL FACTORS: Fitness, Past/current experiences, and Profession are also affecting patient's functional outcome.   REHAB POTENTIAL: Excellent  CLINICAL DECISION MAKING: Stable/uncomplicated  EVALUATION COMPLEXITY: Moderate   GOALS: Goals reviewed with patient? Yes  SHORT TERM GOALS: Target date: 02/10/2023   Pt will independent with HEP to improve strength, ROM and ankle stability and allow improved independence with ADLS and reduce pain  Baseline: provided Goal status: INITIAL   LONG TERM GOALS: Target date: 02/24/2023    Pt will increase Foto to 59 to indicate improve  function and reduced pain in LLE Baseline: 37. 5/16: 59 Goal status: MET  2.  Pt will improve FGA to >19 to indicate reduced fall risk and improved safety with community mobility  Baseline: 18; 4/30: 25/30  Goal status: MET  3.  Pt will increase 6 min walk test to  >1528ft to indicate improved community access and reduced disability  Baseline: 1116.; 5/16: 1298 feet  Goal status: REVISED  4.  Pt will increase L ankle DF to at least 4+/5 to allow improved functional with work tasks and returned to PLOF Baseline: 4-/5 Goal status: INITIAL    PLAN:  PT FREQUENCY: 1-2x/week  PT DURATION: 8 weeks  PLANNED INTERVENTIONS: Therapeutic exercises, Therapeutic activity, Neuromuscular re-education, Balance training, Gait training, Patient/Family education, Self Care, Joint mobilization, Joint manipulation, Stair training, DME instructions, Dry Needling, Moist heat, and Manual therapy  PLAN FOR NEXT SESSION:   Continue to re-assess progress and increase dynamic load on L ankle with stability and functional strengthening.    Golden Pop PT ,DPT Physical Therapist- [place]  [facility]   9:19 AM 03/04/23

## 2023-03-05 NOTE — Therapy (Deleted)
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  Rationale for Evaluation and Treatment: Rehabilitation  ONSET DATE: 11/03/23.   SUBJECTIVE:   SUBJECTIVE STATEMENT: Pt reports that she is doing well. No pain at start of PT session. States that she had mild soreness in L ankle and R knee yesterday due to highly active day at father's birthday party, but today has been a good day.    PERTINENT HISTORY: Pt Working at [employer] center for [employer]. On 11/03/23, Pt was getting totes off pallets, and pallet board broke causing L ankle roll andpain in R knee and L ankle. Pt has mild baseline scoliosis  PAIN:  Are you having pain? Yes: NPRS scale: 2/10 Pain location: L ankle  Pain description: sharp  Aggravating factors: standing for long periods for time Relieving factors: taking weight off ankle   PRECAUTIONS: None Latex Allergies   WEIGHT BEARING RESTRICTIONS: No  FALLS:  Has patient fallen in last 6 months? Yes. Number of falls 1 at time of injury   LIVING ENVIRONMENT: Lives with: lives with their family Lives in:  House/apartment Stairs: No Has following equipment at home: Single point cane has not used since first week since injury   OCCUPATION: Works in [employer]   PLOF: Independent  PATIENT GOALS: be able to lift without pain and perform work tasks without pain.   NEXT MD VISIT: 02/05/23  OBJECTIVE:  takes on Evaluation unless otherwise noted.   DIAGNOSTIC FINDINGS:   02/08/2023 EXAM: MRI OF THE LEFT FOOT WITHOUT AND WITH CONTRAST IMPRESSION: Torn distal calcaneofibular ligament with adjacent thin soft tissue enhancement and along the lateral aspect of the calcaneus deep to the peroneal tendons, favored to reflect focal inflammatory change/enhancing fibrous tissue related to prior injury. Intact but thickened anterior talofibular ligament, compatible with chronic sprain.   Focal subchondral marrow edema along the lateral talar dome with mild irregularity of the adjacent subchondral bone plate and overlying cartilage, suspicious for a small low-grade osteochondral injury. No evidence of unstable fragment.   2.8 x 1.1 x 1.4 cm ganglion cyst along the dorsal lateral hindfoot within the extensor digitorum brevis origin.   11/27/2022 EXAM: LEFT ANKLE COMPLETE - 3+ VIEW   COMPARISON:  None Available.   FINDINGS: There is no evidence of fracture, dislocation, or joint effusion. There is no evidence of arthropathy or other focal bone abnormality. Soft tissues are unremarkable.   IMPRESSION:  Negative.  PATIENT SURVEYS:  FOTO 37   COGNITION: Overall cognitive status: Within functional limits for tasks assessed     SENSATION: WFL  EDEMA:   mild palpable  edema over Cubiod.      POSTURE: rounded shoulders, forward head, and increased thoracic kyphosis mild lumbothoracic scoliosis   PALPATION: Noted edema in the lateral dorsum on the L foot.   LOWER EXTREMITY ROM:  Active ROM Right eval Left eval  Hip flexion [facility]  Hip extension [facility]   Hip abduction WFL   Hip  adduction WFL   Hip internal rotation [facility]   Hip external rotation [facility]   Knee flexion [facility] WFL  Knee extension [facility] WFL  Ankle dorsiflexion 104 98(pain)  Ankle plantarflexion 153 130  Ankle inversion 30 20  Ankle eversion 40 40   (Blank rows = not tested)  LOWER EXTREMITY MMT:  MMT Right eval Left eval  Hip flexion 4+/5  4+/5   Hip extension 5/5 5/5  Hip abduction 4+/5  4+/5   Hip adduction 4+/5  4+/5   Hip internal rotation    Hip external rotation    Knee flexion 4+/5  5/5   Knee extension 4+/5 5/5  Ankle dorsiflexion 5/5 4-/5  Ankle plantarflexion 5/5 4-/5  Ankle inversion 5/5 4-/5  Ankle eversion 5/5 4-/5   (Blank rows = not tested)  LOWER EXTREMITY SPECIAL TESTS:  Ankle special tests: Tibial torsion test: positive , Anterior drawer test: positive , Talar tilt test: positive , and Great toe extension test: negative  FUNCTIONAL TESTS:  5 times sit to stand: 15.45, 14  Timed up and go (TUG): 10.8,   6 minute walk test: 1116,   10 meter walk test: 10.2,  Berg Balance Scale: 52,  Functional gait assessment: 18 SLS: RLE: 19.74sec LLE: 3.95sec       GAIT: Distance walked: 77ft Assistive device utilized: None Level of assistance: Complete Independence Comments: antalgic on the LLE   TODAY'S TREATMENT:                                                                                                                              DATE: 03/05/2023   Nustep level AAROM for L ankle and R knee level 2 x4 min BLE only. Noted to have increased ROM in the L ankle with increased time.   Seated: If painful day  LAQ x 10 BLE, RTB HS curl x 15 BLE, RTB Hip abduction x 12 RTB BLE Hip flexion RTB x 10 bil  LE push down x 12 bil RTB,  Heel slide x 20 bil   Heel raise/toe raise x 20 bil  BABS board level 3   If good day: Seated: BLE on airex pad. Toe splaying x 20  Arch lift x 20 Ankle DF/PF with heal on pad x 25  Hip abduction x 12 GTB Hip flexion x 12 GTB   Toe yoga  x 20   Rocker board with UE support as needed at rail on wall:  AP control squat with UE support 2x 12  AP rock with intermittent UE support 2x 15  Lateral control squat  2x 12 with UE support  Lateral rock R and L 2x 15 bil with UE support    Standing at stair rail  Terminal knee extension GTB x 12 bil  Hip extension x 12 bil GTB    Seated: heel slide x 15 BLE only.   Standing BLE on airex pad. Toe splaying x 20  Foot tap to target on 4inch airex pad x10 bil  Standing on level ground;  Forward lunge with front leg on 4inch step.     Throughout session pt monitored for change in status or increased pain in affected BLE, required a multiple therapeutic rest breaks due to R knee or L ankle soreness following standing therex. Resolved with short therapeutic rest break.     PATIENT EDUCATION:  Education details: Pt educated on POC, rehab potential as well as  throughout session about proper posture and technique with exercises. Improved exercise technique, movement at target joints, use of target muscles after min to mod verbal, visual, tactile cues.  Person educated: Patient Education method: Medical illustrator Education comprehension: verbalized understanding  HOME EXERCISE PROGRAM: Access Code: [email or web address] URL: [email or web address] Date: 01/25/2023 Prepared by: Grier Rocher  Exercises - Seated Ankle Eversion with Resistance  - 1 x daily - 7 x weekly - 3 sets - 10 reps - Seated Ankle Inversion with Resistance  - 1 x daily - 7 x weekly - 3 sets - 10 reps - Seated Ankle Dorsiflexion with Resistance  - 1 x daily - 7 x weekly - 3 sets - 10 reps - Sitting Knee Extension with Resistance  - 1 x daily - 7 x weekly - 3 sets - 10 reps - Seated Hip Abduction with Resistance  - 1 x daily - 7 x weekly - 3 sets - 10 reps - Seated Hamstring Curl with Anchored Resistance  - 1 x daily - 7 x weekly - 3 sets - 10 reps  URL: [email or web address] Date:  01/13/2023 Prepared by: Grier Rocher  Exercises - Ankle Inversion Eversion Towel Slide  - 1 x daily - 7 x weekly - 3 sets - 10 reps - Towel Scrunches  - 1 x daily - 7 x weekly - 3 sets - 10 reps - Seated Heel Slide  - 1 x daily - 7 x weekly - 3 sets - 10 reps  ASSESSMENT:  CLINICAL IMPRESSION:  Pt put forth excellent effort in PT treatment on this day for all therapeutic exercise. Pt reports increased soreness in ankle and R knee compared to prior session. Session focused on BLE strengthening and ankle ROM due to pt's pain limitations on this day. Pt will continue to benefit from skilled PT to improve function, reduce pain and increase strength to allow return to PLOF and increased QoL.   OBJECTIVE IMPAIRMENTS: Abnormal gait, decreased activity tolerance, decreased balance, decreased knowledge of use of DME, decreased mobility, difficulty walking, decreased ROM, and decreased strength.   ACTIVITY LIMITATIONS: carrying, bending, standing, squatting, stairs, and locomotion level  PARTICIPATION LIMITATIONS: driving, shopping, community activity, occupation, and yard work  PERSONAL FACTORS: Fitness, Past/current experiences, and Profession are also affecting patient's functional outcome.   REHAB POTENTIAL: Excellent  CLINICAL DECISION MAKING: Stable/uncomplicated  EVALUATION COMPLEXITY: Moderate   GOALS: Goals reviewed with patient? Yes  SHORT TERM GOALS: Target date: 02/10/2023   Pt will independent with HEP to improve strength, ROM and ankle stability and allow improved independence with ADLS and reduce pain  Baseline: provided Goal status: INITIAL   LONG TERM GOALS:  Target date: 02/24/2023    Pt will increase Foto to 59 to indicate improve function and reduced pain in LLE Baseline: 37. 5/16: 59 Goal status: MET  2.  Pt will improve FGA to >19 to indicate reduced fall risk and improved safety with community mobility  Baseline: 18; 4/30: 25/30  Goal status: MET  3.  Pt  will increase 6 min walk test to >1511ft to indicate improved community access and reduced disability  Baseline: 1116.; 5/16: 1298 feet  Goal status: REVISED  4.  Pt will increase L ankle DF to at least 4+/5 to allow improved functional with work tasks and returned to PLOF Baseline: 4-/5 Goal status: INITIAL    PLAN:  PT FREQUENCY: 1-2x/week  PT DURATION: 8 weeks  PLANNED INTERVENTIONS: Therapeutic exercises, Therapeutic activity, Neuromuscular re-education, Balance training, Gait training, Patient/Family education, Self Care, Joint mobilization, Joint manipulation, Stair training, DME instructions, Dry Needling, Moist heat, and Manual therapy  PLAN FOR NEXT SESSION:   Continue to re-assess progress and increase dynamic load on L ankle with stability and functional strengthening.    Norman Herrlich PT ,DPT Physical Therapist- [place]  [facility]   10:33 AM 03/05/23

## 2023-03-09 ENCOUNTER — Telehealth: Payer: Self-pay | Admitting: Physical Therapy

## 2023-03-09 ENCOUNTER — Ambulatory Visit: Payer: PRIVATE HEALTH INSURANCE | Admitting: Physical Therapy

## 2023-03-09 NOTE — Telephone Encounter (Signed)
Pt contacted via telephone and author left voice mail informing of missed appointment and informed her regarding other appointment times this afternoon and requested call back.   Thresa Ross PT, DPT

## 2023-03-11 ENCOUNTER — Ambulatory Visit: Payer: PRIVATE HEALTH INSURANCE | Admitting: Physical Therapy

## 2023-03-11 DIAGNOSIS — R262 Difficulty in walking, not elsewhere classified: Secondary | ICD-10-CM

## 2023-03-11 DIAGNOSIS — M6281 Muscle weakness (generalized): Secondary | ICD-10-CM | POA: Diagnosis not present

## 2023-03-11 DIAGNOSIS — M25572 Pain in left ankle and joints of left foot: Secondary | ICD-10-CM

## 2023-03-11 NOTE — Addendum Note (Signed)
Addended by: Golden Pop on: 03/11/2023 08:02 AM   Modules accepted: Orders

## 2023-03-11 NOTE — Therapy (Signed)
OUTPATIENT PHYSICAL THERAPY LOWER EXTREMITY Treatment/     Patient Name: Stacy Joseph MRN: [id number] DOB:Dec 18, 1990, 32 y.o., female Today's Date: 03/04/2023  END OF SESSION:  PT End of Session - 03/11/23 0809     Visit Number 13    Number of Visits 20    Date for PT Re-Evaluation 04/22/23    Progress Note Due on Visit 20    PT Start Time 0805    PT Stop Time 0850    PT Time Calculation (min) 45 min    Activity Tolerance Patient tolerated treatment well    Behavior During Therapy [facility] for tasks assessed/performed                Past Medical History:  Diagnosis Date   Allergy    Depression    Past Surgical History:  Procedure Laterality Date   TONSILLECTOMY AND ADENOIDECTOMY     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   SUBJECTIVE:   SUBJECTIVE STATEMENT: Pt reports that she is doing well today. Reports that she missed last PT session with "stomach bug". No residual s/s.  Mild stiffness in the L ankle on this day. Pt does not rate.   PERTINENT HISTORY: Pt Working at [employer] center for [employer]. On 11/03/23, Pt was getting totes off pallets, and pallet board broke causing L ankle roll andpain in R knee and L ankle. Pt has mild baseline scoliosis   PAIN:  Are you having pain? Yes: NPRS scale: 2/10 Pain location: L ankle  Pain description: stiffness  Aggravating factors: standing for long periods for time Relieving factors: taking weight off ankle   PRECAUTIONS: None Latex Allergies   WEIGHT BEARING RESTRICTIONS: No  FALLS:  Has patient fallen in last 6 months? Yes. Number of falls 1 at time of injury   LIVING ENVIRONMENT: Lives with: lives with their family Lives in: House/apartment Stairs: No Has following equipment at home: Single point cane has not used since first week since injury   OCCUPATION: Works in [employer]   PLOF: Independent  PATIENT GOALS: be able to lift without pain and perform work tasks without pain.   NEXT MD VISIT: 02/05/23  OBJECTIVE:  takes on Evaluation unless otherwise noted.   DIAGNOSTIC FINDINGS:   02/08/2023 EXAM: MRI OF THE LEFT FOOT WITHOUT AND WITH CONTRAST IMPRESSION: Torn distal calcaneofibular ligament with adjacent thin soft tissue enhancement and along the lateral aspect of the calcaneus deep to the peroneal tendons, favored to reflect focal inflammatory change/enhancing fibrous tissue related to prior injury. Intact but thickened anterior talofibular ligament, compatible with chronic sprain.   Focal subchondral marrow edema along the lateral talar dome with mild irregularity of the adjacent subchondral bone plate and overlying cartilage, suspicious for a small low-grade osteochondral injury. No evidence of unstable fragment.  2.8 x 1.1 x 1.4 cm ganglion cyst along the dorsal lateral hindfoot within the extensor digitorum brevis origin.   11/27/2022 EXAM: LEFT ANKLE COMPLETE - 3+ VIEW   COMPARISON:  None Available.   FINDINGS: There is no evidence of fracture, dislocation, or joint effusion. There is no evidence of arthropathy or other focal bone abnormality. Soft tissues are unremarkable.   IMPRESSION:  Negative.  PATIENT SURVEYS:  FOTO 37    COGNITION: Overall cognitive status: Within functional limits for tasks assessed     SENSATION: WFL  EDEMA:   mild palpable edema over Cubiod.      POSTURE: rounded shoulders, forward head, and increased thoracic kyphosis mild lumbothoracic scoliosis   PALPATION: Noted edema in the lateral dorsum on the L foot.   LOWER EXTREMITY ROM:  Active ROM Right eval Left eval  Hip flexion [facility]  Hip extension [facility]   Hip abduction WFL   Hip adduction WFL   Hip internal rotation [facility]   Hip external rotation [facility]   Knee flexion [facility] WFL  Knee extension [facility] WFL  Ankle dorsiflexion 104 98(pain)  Ankle plantarflexion 153 130  Ankle inversion 30 20  Ankle eversion 40 40   (Blank rows = not tested)  LOWER EXTREMITY MMT:  MMT Right eval Left eval  Hip flexion 4+/5  4+/5   Hip extension 5/5 5/5  Hip abduction 4+/5  4+/5   Hip adduction 4+/5  4+/5   Hip internal rotation    Hip external rotation    Knee flexion 4+/5  5/5   Knee extension 4+/5 5/5  Ankle dorsiflexion 5/5 4-/5  Ankle plantarflexion 5/5 4-/5  Ankle inversion 5/5 4-/5  Ankle eversion 5/5 4-/5   (Blank rows = not tested)  LOWER EXTREMITY SPECIAL TESTS:  Ankle special tests: Tibial torsion test: positive , Anterior drawer test: positive , Talar tilt test: positive , and Great toe extension test: negative  FUNCTIONAL TESTS:  5 times sit to stand: 15.45, 14  Timed up and go (TUG): 10.8,   6 minute walk test: 1116,   10 meter walk test: 10.2,  Berg Balance Scale: 52,  Functional gait assessment: 18 SLS: RLE: 19.74sec LLE: 3.95sec       GAIT: Distance walked: 74ft Assistive device utilized: None Level of assistance: Complete Independence Comments: antalgic on the LLE   TODAY'S TREATMENT:                                                                                                                              DATE: 03/04/2023   Nustep level AAROM for L ankle and R knee level 1 x4 min BLE only.  Reports mild decreased stiffness in L ankle upon completion  Seated gastroc stretch with gait belt 2 x 30 sec  Standing Ankle flexion with gait belt assisted self mobilzation to distal lower leg 2 x 30 sec  Standing Terminal knee extension GTB 2 x 12   Standing  on ariex : Arch raises x 15 Toe yoga x 15 each   Rocker board AP rocks 2  x15  Rocker board lateral rocks 2 x 15  Rocker board stabilization no UE support 2 x 30 sec  Rocker board lateral stabilization no UE support 2 x 30 sec   Simulated work task for diagonal low to high from cross body reach to target at knee height then over head x 15 bil     Throughout session pt monitored for change in status or increased pain in affected BLE, required a multiple therapeutic rest breaks due to R knee or L ankle soreness following standing therex. Resolved with short therapeutic rest break.     PATIENT EDUCATION:  Education details: Pt educated on POC, rehab potential as well as  throughout session about proper posture and technique with exercises. Improved exercise technique, movement at target joints, use of target muscles after min to mod verbal, visual, tactile cues.  Person educated: Patient Education method: Medical illustrator Education comprehension: verbalized understanding  HOME EXERCISE PROGRAM: Access Code: [email or web address] URL: [email or web address] Date: 01/25/2023 Prepared by: Grier Rocher  Exercises - Seated Ankle Eversion with Resistance  - 1 x daily - 7 x weekly - 3 sets - 10 reps - Seated Ankle Inversion with Resistance  - 1 x daily - 7 x weekly - 3 sets - 10 reps - Seated Ankle Dorsiflexion with Resistance  - 1 x daily - 7 x weekly - 3 sets - 10 reps - Sitting Knee Extension with Resistance  - 1 x daily - 7 x weekly - 3 sets - 10 reps - Seated Hip Abduction with Resistance  - 1 x daily - 7 x weekly - 3 sets - 10 reps - Seated Hamstring Curl with Anchored Resistance  - 1 x daily - 7 x weekly -  3 sets - 10 reps  URL: [email or web address] Date: 01/13/2023 Prepared by: Grier Rocher  Exercises - Ankle Inversion Eversion Towel Slide  - 1 x daily - 7 x weekly - 3 sets - 10 reps - Towel Scrunches  - 1 x daily - 7 x weekly - 3 sets - 10 reps - Seated Heel Slide  - 1 x daily - 7 x weekly - 3 sets - 10 reps  ASSESSMENT:  CLINICAL IMPRESSION:  Pt put forth excellent effort in PT treatment on this day for all therapeutic exercise. No significant increase in pain compared to prior sessions.  Patient tolerated introduction of simulated work tasks, without increased pain on this day.  Patient reports feeling stiff having missed last PT session.  Improved stabilization on rocker board noted on this day.  Pt will continue to benefit from skilled PT to improve function, reduce pain and increase strength to allow return to PLOF and increased QoL.   OBJECTIVE IMPAIRMENTS: Abnormal gait, decreased activity tolerance, decreased balance, decreased knowledge of use of DME, decreased mobility, difficulty walking, decreased ROM, and decreased strength.   ACTIVITY LIMITATIONS: carrying, bending, standing, squatting, stairs, and locomotion level  PARTICIPATION LIMITATIONS: driving, shopping, community activity, occupation, and yard work  PERSONAL FACTORS: Fitness, Past/current experiences, and Profession are also affecting patient's functional outcome.   REHAB POTENTIAL: Excellent  CLINICAL DECISION MAKING: Stable/uncomplicated  EVALUATION COMPLEXITY: Moderate   GOALS: Goals reviewed with patient? Yes  SHORT TERM GOALS: Target date: 02/10/2023   Pt will independent with HEP to improve strength, ROM and ankle stability and allow improved independence with ADLS and reduce pain  Baseline: provided Goal  status: INITIAL   LONG TERM GOALS: Target date: 02/24/2023    Pt will increase Foto to 59 to indicate improve function and reduced pain in LLE Baseline: 37. 5/16: 59 Goal  status: MET  2.  Pt will improve FGA to >19 to indicate reduced fall risk and improved safety with community mobility  Baseline: 18; 4/30: 25/30  Goal status: MET  3.  Pt will increase 6 min walk test to >1586ft to indicate improved community access and reduced disability  Baseline: 1116.; 5/16: 1298 feet  Goal status: REVISED  4.  Pt will increase L ankle DF to at least 4+/5 to allow improved functional with work tasks and returned to PLOF Baseline: 4-/5 Goal status: INITIAL    PLAN:  PT FREQUENCY: 1-2x/week  PT DURATION: 8 weeks  PLANNED INTERVENTIONS: Therapeutic exercises, Therapeutic activity, Neuromuscular re-education, Balance training, Gait training, Patient/Family education, Self Care, Joint mobilization, Joint manipulation, Stair training, DME instructions, Dry Needling, Moist heat, and Manual therapy  PLAN FOR NEXT SESSION:   Continue to re-assess progress and increase dynamic load on L ankle with stability and functional strengthening.  Incorporate simulated work tasks.   Golden Pop PT ,DPT Physical Therapist- [place]  [place]   9:19 AM 03/04/23

## 2023-03-16 ENCOUNTER — Ambulatory Visit: Payer: PRIVATE HEALTH INSURANCE | Attending: Orthopaedic Surgery | Admitting: Physical Therapy

## 2023-03-16 DIAGNOSIS — M25572 Pain in left ankle and joints of left foot: Secondary | ICD-10-CM | POA: Diagnosis present

## 2023-03-16 DIAGNOSIS — R262 Difficulty in walking, not elsewhere classified: Secondary | ICD-10-CM | POA: Diagnosis present

## 2023-03-16 DIAGNOSIS — M6281 Muscle weakness (generalized): Secondary | ICD-10-CM | POA: Insufficient documentation

## 2023-03-16 NOTE — Therapy (Signed)
OUTPATIENT PHYSICAL THERAPY LOWER EXTREMITY Treatment/     Patient Name: Stacy Joseph MRN: [id number] DOB:May 18, 1991, 32 y.o., female Today's Date: 03/16/2023  END OF SESSION:  PT End of Session - 03/16/23 0841     Visit Number 14    Number of Visits 20    Date for PT Re-Evaluation 04/22/23    Progress Note Due on Visit 20    PT Start Time 0844    PT Stop Time 0930    PT Time Calculation (min) 46 min    Activity Tolerance Patient tolerated treatment well    Behavior During Therapy [facility] for tasks assessed/performed                Past Medical History:  Diagnosis Date   Allergy    Depression    Past Surgical History:  Procedure Laterality Date   TONSILLECTOMY AND ADENOIDECTOMY     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   SUBJECTIVE:   SUBJECTIVE STATEMENT: Pt reports that she is doing well today. Only mild stiffness noted in the LLE, but reports 2/10 "tightness" in the R knee.  Pt reports next MD appointment is June 12.  PERTINENT HISTORY: Pt Working at [employer] center for [employer]. On 11/03/23, Pt was getting totes off pallets, and pallet board broke causing L ankle roll andpain in R knee and L ankle. Pt has mild baseline scoliosis  PAIN:  Are you  having pain? Yes: NPRS scale: 2/10 Pain location: L ankle  Pain description: stiffness  Aggravating factors: standing for long periods for time Relieving factors: taking weight off ankle   PRECAUTIONS: None Latex Allergies   WEIGHT BEARING RESTRICTIONS: No  FALLS:  Has patient fallen in last 6 months? Yes. Number of falls 1 at time of injury   LIVING ENVIRONMENT: Lives with: lives with their family Lives in: House/apartment Stairs: No Has following equipment at home: Single point cane has not used since first week since injury   OCCUPATION: Works in [employer]   PLOF: Independent  PATIENT GOALS: be able to lift without pain and perform work tasks without pain.   NEXT MD VISIT: 02/05/23  OBJECTIVE:  takes on Evaluation unless otherwise noted.   DIAGNOSTIC FINDINGS:   02/08/2023 EXAM: MRI OF THE LEFT FOOT WITHOUT AND WITH CONTRAST IMPRESSION: Torn distal calcaneofibular ligament with adjacent thin soft tissue enhancement and along the lateral aspect of the calcaneus deep to the peroneal tendons, favored to reflect focal inflammatory change/enhancing fibrous tissue related to prior injury. Intact but thickened anterior talofibular ligament, compatible with chronic sprain.   Focal subchondral marrow edema along the lateral talar dome with mild irregularity of the adjacent subchondral bone plate and overlying cartilage, suspicious for a small low-grade osteochondral injury. No evidence of unstable fragment.   2.8 x  1.1 x 1.4 cm ganglion cyst along the dorsal lateral hindfoot within the extensor digitorum brevis origin.   11/27/2022 EXAM: LEFT ANKLE COMPLETE - 3+ VIEW   COMPARISON:  None Available.   FINDINGS: There is no evidence of fracture, dislocation, or joint effusion. There is no evidence of arthropathy or other focal bone abnormality. Soft tissues are unremarkable.   IMPRESSION:  Negative.  PATIENT SURVEYS:  FOTO 37   COGNITION: Overall cognitive  status: Within functional limits for tasks assessed     SENSATION: WFL  EDEMA:   mild palpable edema over Cubiod.      POSTURE: rounded shoulders, forward head, and increased thoracic kyphosis mild lumbothoracic scoliosis   PALPATION: Noted edema in the lateral dorsum on the L foot.   LOWER EXTREMITY ROM:  Active ROM Right eval Left eval  Hip flexion [facility]  Hip extension [facility]   Hip abduction WFL   Hip adduction WFL   Hip internal rotation [facility]   Hip external rotation [facility]   Knee flexion [facility] WFL  Knee extension [facility] WFL  Ankle dorsiflexion 104 98(pain)  Ankle plantarflexion 153 130  Ankle inversion 30 20  Ankle eversion 40 40   (Blank rows = not tested)  LOWER EXTREMITY MMT:  MMT Right eval Left eval  Hip flexion 4+/5  4+/5   Hip extension 5/5 5/5  Hip abduction 4+/5  4+/5   Hip adduction 4+/5  4+/5   Hip internal rotation    Hip external rotation    Knee flexion 4+/5  5/5   Knee extension 4+/5 5/5  Ankle dorsiflexion 5/5 4-/5  Ankle plantarflexion 5/5 4-/5  Ankle inversion 5/5 4-/5  Ankle eversion 5/5 4-/5   (Blank rows = not tested)  LOWER EXTREMITY SPECIAL TESTS:  Ankle special tests: Tibial torsion test: positive , Anterior drawer test: positive , Talar tilt test: positive , and Great toe extension test: negative  FUNCTIONAL TESTS:  5 times sit to stand: 15.45, 14  Timed up and go (TUG): 10.8,   6 minute walk test: 1116,   10 meter walk test: 10.2,  Berg Balance Scale: 52,  Functional gait assessment: 18 SLS: RLE: 19.74sec LLE: 3.95sec       GAIT: Distance walked: 79ft Assistive device utilized: None Level of assistance: Complete Independence Comments: antalgic on the LLE   TODAY'S TREATMENT:                                                                                                                              DATE: 03/16/2023   Nustep level AAROM for L ankle and R knee level 1 x5 min BLE only. Heel slides x 15 bilateral, x 20  bilateral. Seated gastroc stretch with gait belt 2 x 30 sec  Standing tall Rocker board AP rocks 2  x15  Standing tall Rocker board stabilization no intermittent upper extremity support 2 x 30 sec and bilateral upper extremity support 2 x 30 seconds  Kettle bell lift 2 x 5 from 24 inch box Kettle bell left 2 x 5 from 18 inch box Simulated work task for diagonal low to high from cross body reach to target at knee height to hip height x 12 bil 5 kg medicine ball.  Weighted gait with 5 kg medicine ball x 180 feet Sidestepping with 5 kg medicine ball 2 x 15 feet bilateral  Throughout session pt monitored for change in status or increased pain in affected BLE, patient reports no increased soreness in left ankle throughout therapeutic interventions on this day but mild irritation of the right knee intermittently.  Pain resolved with short therapeutic rest break.     PATIENT EDUCATION:  Education details: Pt educated on POC, rehab potential as well as  throughout session about proper posture and technique with exercises. Improved exercise technique, movement at target joints, use of target muscles after min to mod verbal, visual, tactile cues.  Person educated: Patient Education method: Medical illustrator Education comprehension: verbalized understanding  HOME EXERCISE PROGRAM: Access Code: [email or web address] URL: [email or web address] Date: 01/25/2023 Prepared by: Grier Rocher  Exercises - Seated Ankle Eversion with Resistance  - 1 x daily - 7 x weekly - 3 sets - 10 reps - Seated Ankle Inversion with Resistance  - 1 x daily - 7 x weekly - 3 sets - 10 reps - Seated Ankle Dorsiflexion with Resistance  - 1 x daily - 7 x weekly - 3 sets - 10 reps - Sitting Knee Extension with Resistance  - 1 x daily - 7 x weekly - 3 sets - 10 reps - Seated Hip Abduction with Resistance  - 1 x daily - 7 x weekly - 3 sets - 10 reps - Seated Hamstring Curl with Anchored Resistance  - 1 x daily -  7 x weekly - 3 sets - 10 reps  URL: [email or web address] Date: 01/13/2023 Prepared by: Grier Rocher  Exercises - Ankle Inversion Eversion Towel Slide  - 1 x daily - 7 x weekly - 3 sets - 10 reps - Towel Scrunches  - 1 x daily - 7 x weekly - 3 sets - 10 reps - Seated Heel Slide  - 1 x daily - 7 x weekly - 3 sets - 10 reps  ASSESSMENT:  CLINICAL IMPRESSION:  Pt put forth excellent effort in PT treatment on this day for all therapeutic exercise.  Patient reporting no increased pain in left ankle and throughout therapeutic interventions on this day, but mild right knee stiffness with weighted tasks.  Able to increase load on bilateral lower extremity with simulated work tasks on this day with only mild increased pain noted in the right knee only.  Pt will continue to benefit from skilled PT to improve function, reduce pain and increase strength to allow return to PLOF and increased QoL.   OBJECTIVE IMPAIRMENTS: Abnormal gait, decreased activity tolerance, decreased balance, decreased knowledge of use of DME, decreased mobility, difficulty walking, decreased ROM, and decreased strength.   ACTIVITY LIMITATIONS: carrying, bending, standing, squatting, stairs, and locomotion level  PARTICIPATION LIMITATIONS: driving, shopping, community activity, occupation, and yard work  PERSONAL FACTORS: Fitness, Past/current experiences, and Profession are also affecting patient's functional outcome.   REHAB POTENTIAL: Excellent  CLINICAL DECISION MAKING: Stable/uncomplicated  EVALUATION COMPLEXITY: Moderate   GOALS: Goals reviewed with patient? Yes  SHORT TERM GOALS: Target date: 02/10/2023   Pt will independent with HEP to improve strength, ROM and ankle stability and allow improved independence with ADLS and reduce pain  Baseline: provided Goal status: INITIAL   LONG TERM GOALS: Target date: 02/24/2023    Pt will increase Foto to 59 to indicate improve function and reduced  pain in LLE Baseline: 37. 5/16: 59 Goal status: MET  2.  Pt will improve FGA to >19 to indicate reduced fall risk and improved safety with community mobility  Baseline: 18; 4/30: 25/30  Goal status: MET  3.  Pt will increase 6 min walk test to >1563ft to indicate improved community access and reduced disability  Baseline: 1116.; 5/16: 1298 feet  Goal status: REVISED  4.  Pt will increase L ankle DF to at least 4+/5 to allow improved functional with work tasks and returned to PLOF Baseline: 4-/5 Goal status: INITIAL    PLAN:  PT FREQUENCY: 1-2x/week  PT DURATION: 8 weeks  PLANNED INTERVENTIONS: Therapeutic exercises, Therapeutic activity, Neuromuscular re-education, Balance training, Gait training, Patient/Family education, Self Care, Joint mobilization, Joint manipulation, Stair training, DME instructions, Dry Needling, Moist heat, and Manual therapy  PLAN FOR NEXT SESSION:   Continue to re-assess progress and increase dynamic load on L ankle with stability and functional strengthening.  Incorporate simulated work tasks.   Golden Pop PT ,DPT Physical Therapist- [place]  [facility]   9:32 AM 03/16/23

## 2023-03-18 ENCOUNTER — Ambulatory Visit: Payer: PRIVATE HEALTH INSURANCE | Admitting: Physical Therapy

## 2023-03-18 DIAGNOSIS — M6281 Muscle weakness (generalized): Secondary | ICD-10-CM

## 2023-03-18 DIAGNOSIS — M25572 Pain in left ankle and joints of left foot: Secondary | ICD-10-CM

## 2023-03-18 DIAGNOSIS — R262 Difficulty in walking, not elsewhere classified: Secondary | ICD-10-CM

## 2023-03-18 NOTE — Therapy (Signed)
OUTPATIENT PHYSICAL THERAPY LOWER EXTREMITY Treatment     Patient Name: Stacy Joseph MRN: [id number] DOB:[birth year], 32 y.o., female Today's Date: 03/18/2023  END OF SESSION:  PT End of Session - 03/18/23 0803     Visit Number 15    Number of Visits 20    Date for PT Re-Evaluation 04/22/23    Progress Note Due on Visit 20    PT Start Time 0801    PT Stop Time 0846    PT Time Calculation (min) 45 min    Activity Tolerance Patient tolerated treatment well    Behavior During Therapy [facility] for tasks assessed/performed                Past Medical History:  Diagnosis Date   Allergy    Depression    Past Surgical History:  Procedure Laterality Date   TONSILLECTOMY AND ADENOIDECTOMY     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   SUBJECTIVE:   SUBJECTIVE STATEMENT: Pt reports that she is doing well today. Reports that Ankle feels "wonky" this morning from tossing and turning overnight, no report of pain this morning.  PERTINENT HISTORY: Pt Working at [employer] center for [employer]. On 11/03/23, Pt was getting totes off pallets, and pallet board broke causing L ankle roll andpain in R knee and L ankle. Pt has mild baseline scoliosis  PAIN:  Are you having pain?  Yes: NPRS scale: 2/10 Pain location: L ankle  Pain description: stiffness  Aggravating factors: standing for long periods for time Relieving factors: taking weight off ankle   PRECAUTIONS: None Latex Allergies   WEIGHT BEARING RESTRICTIONS: No  FALLS:  Has patient fallen in last 6 months? Yes. Number of falls 1 at time of injury   LIVING ENVIRONMENT: Lives with: lives with their family Lives in: House/apartment Stairs: No Has following equipment at home: Single point cane has not used since first week since injury   OCCUPATION: Works in [employer]   PLOF: Independent  PATIENT GOALS: be able to lift without pain and perform work tasks without pain.   NEXT MD VISIT: 02/05/23  OBJECTIVE:  takes on Evaluation unless otherwise noted.   DIAGNOSTIC FINDINGS:   02/08/2023 EXAM: MRI OF THE LEFT FOOT WITHOUT AND WITH CONTRAST IMPRESSION: Torn distal calcaneofibular ligament with adjacent thin soft tissue enhancement and along the lateral aspect of the calcaneus deep to the peroneal tendons, favored to reflect focal inflammatory change/enhancing fibrous tissue related to prior injury. Intact but thickened anterior talofibular ligament, compatible with chronic sprain.   Focal subchondral marrow edema along the lateral talar dome with mild irregularity of the adjacent subchondral bone plate and overlying cartilage, suspicious for a small low-grade osteochondral injury. No evidence of unstable fragment.   2.8 x 1.1 x 1.4 cm ganglion cyst  along the dorsal lateral hindfoot within the extensor digitorum brevis origin.   11/27/2022 EXAM: LEFT ANKLE COMPLETE - 3+ VIEW   COMPARISON:  None Available.   FINDINGS: There is no evidence of fracture, dislocation, or joint effusion. There is no evidence of arthropathy or other focal bone abnormality. Soft tissues are unremarkable.   IMPRESSION:  Negative.  PATIENT SURVEYS:  FOTO 37   COGNITION: Overall cognitive status: Within  functional limits for tasks assessed     SENSATION: WFL  EDEMA:   mild palpable edema over Cubiod.      POSTURE: rounded shoulders, forward head, and increased thoracic kyphosis mild lumbothoracic scoliosis   PALPATION: Noted edema in the lateral dorsum on the L foot.   LOWER EXTREMITY ROM:  Active ROM Right eval Left eval  Hip flexion [facility]  Hip extension [facility]   Hip abduction WFL   Hip adduction WFL   Hip internal rotation [facility]   Hip external rotation [facility]   Knee flexion [facility] WFL  Knee extension [facility] WFL  Ankle dorsiflexion 104 98(pain)  Ankle plantarflexion 153 130  Ankle inversion 30 20  Ankle eversion 40 40   (Blank rows = not tested)  LOWER EXTREMITY MMT:  MMT Right eval Left eval  Hip flexion 4+/5  4+/5   Hip extension 5/5 5/5  Hip abduction 4+/5  4+/5   Hip adduction 4+/5  4+/5   Hip internal rotation    Hip external rotation    Knee flexion 4+/5  5/5   Knee extension 4+/5 5/5  Ankle dorsiflexion 5/5 4-/5  Ankle plantarflexion 5/5 4-/5  Ankle inversion 5/5 4-/5  Ankle eversion 5/5 4-/5   (Blank rows = not tested)  LOWER EXTREMITY SPECIAL TESTS:  Ankle special tests: Tibial torsion test: positive , Anterior drawer test: positive , Talar tilt test: positive , and Great toe extension test: negative  FUNCTIONAL TESTS:  5 times sit to stand: 15.45, 14  Timed up and go (TUG): 10.8,   6 minute walk test: 1116,   10 meter walk test: 10.2,  Berg Balance Scale: 52,  Functional gait assessment: 18 SLS: RLE: 19.74sec LLE: 3.95sec       GAIT: Distance walked: 34ft Assistive device utilized: None Level of assistance: Complete Independence Comments: antalgic on the LLE   TODAY'S TREATMENT:                                                                                                                              DATE: 03/18/2023   Nustep level AAROM for L ankle and R knee level 3 x5 min BLE only. Heel slides x 15 bilateral, x 20 bilateral. Seated  gastroc stretch with gait belt 2 x 30 sec  Standing bosu ball AP rocks   x15  Standing bosu ball  stabilization no intermittent upper extremity support 2 x 30 sec  Standing bosu ball lateral rocks x 15  Standing on bosu ball vertical ball toss  2 x 30 seconds. Standing on Bosu ball; bounce of ball of wall 2 x 15  Seated hip abduction green Thera-Band 2 x 10 Ankle eversion red Thera-Band x 10 bilateral Ankle dorsiflexion red Thera-Band x 10 bilateral Ankle plantarflexion red Thera-Band x 15 bilateral     Throughout session pt monitored for change in status or increased pain in affected BLE, patient reports no increased soreness in left ankle throughout therapeutic interventions on this day.   PATIENT EDUCATION:  Education details: Pt educated on POC, rehab potential as well as  throughout session about proper posture and technique with exercises. Improved exercise technique, movement at target joints, use of target muscles after min to mod verbal, visual, tactile cues.  Person educated: Patient Education method: Medical illustrator Education comprehension: verbalized understanding  HOME EXERCISE PROGRAM:  Access Code: [email or web address] URL: [email or web address] Date: 03/18/2023 Prepared by: Grier Rocher  Exercises - Seated Ankle Eversion with Resistance  - 1 x daily - 5 x weekly - 2 sets - 12 reps - 2 hold - Seated Ankle Plantarflexion with Resistance  - 1 x daily - 5 x weekly - 2 sets - 12 reps - 2 hold - Seated Ankle Dorsiflexion with Resistance  - 1 x daily - 5 x weekly - 2 sets - 12 reps - 2 hold - Seated Ankle Plantar Flexion with Resistance Loop  - 1 x daily - 5 x weekly - 2 sets - 12 reps - 2 hold  Access Code: [email or web address] URL: [email or web address] Date: 01/25/2023 Prepared by: Grier Rocher  Exercises - Seated Ankle Eversion with Resistance  - 1 x daily - 7 x weekly - 3 sets - 10 reps - Seated Ankle Inversion with Resistance  - 1 x daily  - 7 x weekly - 3 sets - 10 reps - Seated Ankle Dorsiflexion with Resistance  - 1 x daily - 7 x weekly - 3 sets - 10 reps - Sitting Knee Extension with Resistance  - 1 x daily - 7 x weekly - 3 sets - 10 reps - Seated Hip Abduction with Resistance  - 1 x daily - 7 x weekly - 3 sets - 10 reps - Seated Hamstring Curl with Anchored Resistance  - 1 x daily - 7 x weekly - 3 sets - 10 reps  URL: [email or web address] Date: 01/13/2023 Prepared by: Grier Rocher  Exercises - Ankle Inversion Eversion Towel Slide  - 1 x daily - 7 x weekly - 3 sets - 10 reps - Towel Scrunches  - 1 x daily - 7 x weekly - 3 sets - 10 reps - Seated Heel Slide  - 1 x daily - 7 x weekly - 3 sets - 10 reps  ASSESSMENT:  CLINICAL IMPRESSION:  Pt put forth excellent effort in PT treatment on this day for all therapeutic exercise.  Patient able to tolerate increased resistance to left ankle on this day without increased pain.  Session focused on functional stability and increased ankle strength with added use of resistance bands.  Patient continues to demonstrate improved functional use of bilateral lower extremity with reduced pain and stiffness compared to prior therapy sessions.  Pt will continue to benefit from skilled PT to improve function, reduce pain and increase strength to allow return to PLOF and increased QoL.   OBJECTIVE IMPAIRMENTS: Abnormal gait, decreased activity tolerance, decreased balance, decreased knowledge of use of DME, decreased mobility, difficulty walking, decreased ROM, and decreased strength.   ACTIVITY LIMITATIONS: carrying, bending, standing, squatting, stairs, and locomotion  level  PARTICIPATION LIMITATIONS: driving, shopping, community activity, occupation, and yard work  PERSONAL FACTORS: Fitness, Past/current experiences, and Profession are also affecting patient's functional outcome.   REHAB POTENTIAL: Excellent  CLINICAL DECISION MAKING: Stable/uncomplicated  EVALUATION  COMPLEXITY: Moderate   GOALS: Goals reviewed with patient? Yes  SHORT TERM GOALS: Target date: 02/10/2023   Pt will independent with HEP to improve strength, ROM and ankle stability and allow improved independence with ADLS and reduce pain  Baseline: provided Goal status: INITIAL   LONG TERM GOALS: Target date: 02/24/2023    Pt will increase Foto to 59 to indicate improve function and reduced pain in LLE Baseline: 37. 5/16: 59 Goal status: MET  2.  Pt will improve FGA to >19 to indicate reduced fall risk and improved safety with community mobility  Baseline: 18; 4/30: 25/30  Goal status: MET  3.  Pt will increase 6 min walk test to >1564ft to indicate improved community access and reduced disability  Baseline: 1116.; 5/16: 1298 feet  Goal status: REVISED  4.  Pt will increase L ankle DF to at least 4+/5 to allow improved functional with work tasks and returned to PLOF Baseline: 4-/5 Goal status: INITIAL    PLAN:  PT FREQUENCY: 1-2x/week  PT DURATION: 8 weeks  PLANNED INTERVENTIONS: Therapeutic exercises, Therapeutic activity, Neuromuscular re-education, Balance training, Gait training, Patient/Family education, Self Care, Joint mobilization, Joint manipulation, Stair training, DME instructions, Dry Needling, Moist heat, and Manual therapy  PLAN FOR NEXT SESSION:   Continue to re-assess progress and increase dynamic load on L ankle with stability and functional strengthening.   Re-assess HEP Incorporate simulated work tasks.   Golden Pop PT ,DPT Physical Therapist- [place]  [facility]   9:59 AM 03/18/23

## 2023-03-23 ENCOUNTER — Ambulatory Visit: Payer: PRIVATE HEALTH INSURANCE | Admitting: Physical Therapy

## 2023-03-23 DIAGNOSIS — M6281 Muscle weakness (generalized): Secondary | ICD-10-CM | POA: Diagnosis not present

## 2023-03-23 DIAGNOSIS — M25572 Pain in left ankle and joints of left foot: Secondary | ICD-10-CM

## 2023-03-23 DIAGNOSIS — R262 Difficulty in walking, not elsewhere classified: Secondary | ICD-10-CM

## 2023-03-23 NOTE — Therapy (Signed)
OUTPATIENT PHYSICAL THERAPY LOWER EXTREMITY Treatment     Patient Name: Stacy Joseph MRN: [id number] DOB:1991-08-16, 32 y.o., female Today's Date: 03/23/2023  END OF SESSION:  PT End of Session - 03/23/23 0847     Visit Number 16    Number of Visits 20    Date for PT Re-Evaluation 04/22/23    Progress Note Due on Visit 20    PT Start Time 0848    PT Stop Time 0932    PT Time Calculation (min) 44 min    Activity Tolerance Patient tolerated treatment well    Behavior During Therapy [facility] for tasks assessed/performed                Past Medical History:  Diagnosis Date   Allergy    Depression    Past Surgical History:  Procedure Laterality Date   TONSILLECTOMY AND ADENOIDECTOMY     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   SUBJECTIVE:   SUBJECTIVE STATEMENT: Pt reports to PT treatment stating that her ankle and knee are feeling good. She states that she has some stiffness in the LE, requiring her to complete AROM exercises to reduce stiffness.   PERTINENT HISTORY: Pt Working at [employer] center for [employer]. On 11/03/23, Pt was getting totes off pallets, and pallet board broke causing L ankle roll andpain in R knee and L ankle. Pt has mild baseline  scoliosis  PAIN:  Are you having pain? Yes: NPRS scale: 2/10 Pain location: L ankle  Pain description: stiffness  Aggravating factors: standing for long periods for time Relieving factors: taking weight off ankle   PRECAUTIONS: None Latex Allergies   WEIGHT BEARING RESTRICTIONS: No  FALLS:  Has patient fallen in last 6 months? Yes. Number of falls 1 at time of injury   LIVING ENVIRONMENT: Lives with: lives with their family Lives in: House/apartment Stairs: No Has following equipment at home: Single point cane has not used since first week since injury   OCCUPATION: Works in Naval architect   PLOF: Independent  PATIENT GOALS: be able to lift without pain and perform work tasks without pain.   NEXT MD VISIT: 02/05/23  OBJECTIVE:  takes on Evaluation unless otherwise noted.   DIAGNOSTIC FINDINGS:   02/08/2023 EXAM: MRI OF THE LEFT FOOT WITHOUT AND WITH CONTRAST IMPRESSION: Torn distal calcaneofibular ligament with adjacent thin soft tissue enhancement and along the lateral aspect of the calcaneus deep to the peroneal tendons, favored to reflect focal inflammatory change/enhancing fibrous tissue related to prior injury. Intact but thickened anterior talofibular ligament, compatible with chronic sprain.   Focal subchondral marrow edema along the lateral talar dome with mild irregularity of the adjacent subchondral bone plate and overlying cartilage, suspicious for a small low-grade osteochondral injury. No evidence of unstable fragment.  2.8 x 1.1 x 1.4 cm ganglion cyst along the dorsal lateral hindfoot within the extensor digitorum brevis origin.   11/27/2022 EXAM: LEFT ANKLE COMPLETE - 3+ VIEW   COMPARISON:  None Available.   FINDINGS: There is no evidence of fracture, dislocation, or joint effusion. There is no evidence of arthropathy or other focal bone abnormality. Soft tissues are unremarkable.   IMPRESSION:  Negative.  PATIENT SURVEYS:  FOTO 37    COGNITION: Overall cognitive status: Within functional limits for tasks assessed     SENSATION: WFL  EDEMA:   mild palpable edema over Cubiod.      POSTURE: rounded shoulders, forward head, and increased thoracic kyphosis mild lumbothoracic scoliosis   PALPATION: Noted edema in the lateral dorsum on the L foot.   LOWER EXTREMITY ROM:  Active ROM Right eval Left eval  Hip flexion [facility]  Hip extension [facility]   Hip abduction WFL   Hip adduction WFL   Hip internal rotation [facility]   Hip external rotation [facility]   Knee flexion [facility] WFL  Knee extension [facility]  Ankle dorsiflexion 104 98(pain)  Ankle plantarflexion 153 130  Ankle inversion 30 20  Ankle eversion 40 40   (Blank rows = not tested)  LOWER EXTREMITY MMT:  MMT Right eval Left eval L 6/11  Hip flexion 4+/5  4+/5    Hip extension 5/5 5/5   Hip abduction 4+/5  4+/5    Hip adduction 4+/5  4+/5    Hip internal rotation     Hip external rotation     Knee flexion 4+/5  5/5    Knee extension 4+/5 5/5   Ankle dorsiflexion 5/5 4-/5 4+  Ankle plantarflexion 5/5 4-/5 4  Ankle inversion 5/5 4-/5 4+  Ankle eversion 5/5 4-/5 4+   (Blank rows = not tested)  LOWER EXTREMITY SPECIAL TESTS:  Ankle special tests: Tibial torsion test: positive , Anterior drawer test: positive , Talar tilt test: positive , and Great toe extension test: negative  FUNCTIONAL TESTS:  5 times sit to stand: 15.45, 14  Timed up and go (TUG): 10.8,   6 minute walk test: 1116,   10 meter walk test: 10.2,  Berg Balance Scale: 52,  Functional gait assessment: 18 SLS: RLE: 19.74sec LLE: 3.95sec      GAIT: Distance walked: 35ft Assistive device utilized: None Level of assistance: Complete Independence Comments: antalgic on the LLE   TODAY'S TREATMENT:                                                                                                                              DATE: 03/23/2023    Nustep level AAROM for L ankle and R knee  level 3 x5 min BLE only. Heel slides x 15 bilateral, x 20 bilateral. Seated gastroc stretch with gait belt 2 x 30 sec   Standing lunge on 6inch step x 10 bil Standing bosu static stabilization 2 x 30 sec  Standing on bosu, BUE supported squat x 10  Standing on bosu ball catch x 15,    Seated hip abduction green Thera-Band 2 x 10 Ankle eversion red Thera-Band x 15 bilateral Ankle dorsiflexion red Thera-Band x 15 bilateral Ankle plantarflexion red Thera-Band x 15 bilateral Ankle inversion RTB x 15 bil   6 Min Walk Test:  Instructed patient to ambulate as quickly and as safely as possible for 6 minutes using LRAD. Patient was allowed to take standing rest breaks without stopping the test, but if the patient required a sitting rest break the clock would be stopped and the test would be over.  Results: 1400 feet  using no AD  without assist. Results indicate that the patient has reduced endurance with ambulation compared to age matched norms.  Age Matched Norms: 29-69 yo M: 19 F: 60, 27-79 yo M: 63 F: 471, 70-89 yo M: 417 F: 392 MDC: 58.21 meters (190.98 feet) or 50 meters (ANPTA Core Set of Outcome Measures for Adults with Neurologic Conditions, 2018) No antalgia noted in the LLE on this day.    Throughout session pt monitored for change in status or increased pain in affected BLE, patient reports no increased soreness in left ankle throughout therapeutic interventions on this day.   PATIENT EDUCATION:  Education details: Pt educated on POC, rehab potential as well as  throughout session about proper posture and technique with exercises. Improved exercise technique, movement at target joints, use of target muscles after min to mod verbal, visual, tactile cues.  Person educated: Patient Education method: Medical illustrator Education comprehension: verbalized understanding  HOME EXERCISE PROGRAM:  Access Code: [email or web address] URL: [email or web address] Date:  03/18/2023 Prepared by: Grier Rocher  Exercises - Seated Ankle Eversion with Resistance  - 1 x daily - 5 x weekly - 2 sets - 12 reps - 2 hold - Seated Ankle Plantarflexion with Resistance  - 1 x daily - 5 x weekly - 2 sets - 12 reps - 2 hold - Seated Ankle Dorsiflexion with Resistance  - 1 x daily - 5 x weekly - 2 sets - 12 reps - 2 hold - Seated Ankle Plantar Flexion with Resistance Loop  - 1 x daily - 5 x weekly - 2 sets - 12 reps - 2 hold  Access Code: [email or web address] URL: [email or web address] Date: 01/25/2023 Prepared by: Grier Rocher  Exercises - Seated Ankle Eversion with Resistance  - 1 x daily - 7 x weekly - 3 sets - 10 reps - Seated Ankle Inversion with Resistance  - 1 x daily - 7 x weekly - 3 sets - 10 reps - Seated Ankle Dorsiflexion with Resistance  - 1 x daily - 7 x weekly - 3 sets - 10 reps - Sitting Knee Extension with Resistance  - 1 x daily - 7 x weekly - 3 sets - 10 reps - Seated Hip Abduction with Resistance  - 1 x daily - 7 x weekly - 3 sets - 10 reps - Seated Hamstring Curl with Anchored Resistance  - 1 x daily - 7 x weekly - 3 sets - 10 reps  URL: [email or web address] Date: 01/13/2023 Prepared by: Grier Rocher  Exercises - Ankle Inversion Eversion Towel Slide  - 1 x daily - 7 x weekly - 3 sets - 10 reps - Towel Scrunches  - 1 x daily - 7 x weekly - 3 sets - 10 reps - Seated Heel Slide  - 1 x daily - 7 x weekly - 3 sets -  10 reps  ASSESSMENT:  CLINICAL IMPRESSION:  Pt put forth excellent effort in PT treatment on this day for all therapeutic exercise.  Pt demonstrates improved tolerance to prolonged gait and time in standing with increased distance to 149ft on 6 min walk test, but has not reached goal of 1542ft. Pt also demonstrates improved DF strength to 5/5 and all others tested at 4+/5 in the L ankle. HEP reviewed, and pt able to perform resisted ankle strengthening for BLE with min cues for proper set up and full ROM. Pt will  continue to benefit from skilled PT to improve function, reduce pain and increase strength to allow return to PLOF and increased QoL.   OBJECTIVE IMPAIRMENTS: Abnormal gait, decreased activity tolerance, decreased balance, decreased knowledge of use of DME, decreased mobility, difficulty walking, decreased ROM, and decreased strength.   ACTIVITY LIMITATIONS: carrying, bending, standing, squatting, stairs, and locomotion level  PARTICIPATION LIMITATIONS: driving, shopping, community activity, occupation, and yard work  PERSONAL FACTORS: Fitness, Past/current experiences, and Profession are also affecting patient's functional outcome.   REHAB POTENTIAL: Excellent  CLINICAL DECISION MAKING: Stable/uncomplicated  EVALUATION COMPLEXITY: Moderate   GOALS: Goals reviewed with patient? Yes  SHORT TERM GOALS: Target date: 02/10/2023   Pt will independent with HEP to improve strength, ROM and ankle stability and allow improved independence with ADLS and reduce pain  Baseline: provided Goal status: INITIAL   LONG TERM GOALS: Target date: 02/24/2023    Pt will increase Foto to 59 to indicate improve function and reduced pain in LLE Baseline: 37. 5/16: 59 6/11: 61 Goal status: MET  2.  Pt will improve FGA to >19 to indicate reduced fall risk and improved safety with community mobility  Baseline: 18; 4/30: 25/30  Goal status: MET  3.  Pt will increase 6 min walk test to >1539ft to indicate improved community access and reduced disability  Baseline: 1116.; 5/16: 1298 feet 6/11: 1400 Goal status: IN PROGRESS  4.  Pt will increase L ankle DF to at least 4+/5 to allow improved functional with work tasks and returned to PLOF Baseline: 4-/5 Goal status: INITIAL    PLAN:  PT FREQUENCY: 1-2x/week  PT DURATION: 8 weeks  PLANNED INTERVENTIONS: Therapeutic exercises, Therapeutic activity, Neuromuscular re-education, Balance training, Gait training, Patient/Family education, Self Care,  Joint mobilization, Joint manipulation, Stair training, DME instructions, Dry Needling, Moist heat, and Manual therapy  PLAN FOR NEXT SESSION:   Increase dynamic load on L ankle with stability and functional strengthening.   Incorporate simulated work tasks.   Golden Pop PT ,DPT Physical Therapist-   [facility]   9:55 AM 03/23/23

## 2023-04-01 ENCOUNTER — Ambulatory Visit: Payer: 59 | Attending: Orthopaedic Surgery | Admitting: Physical Therapy

## 2023-04-01 DIAGNOSIS — R262 Difficulty in walking, not elsewhere classified: Secondary | ICD-10-CM | POA: Insufficient documentation

## 2023-04-01 DIAGNOSIS — M6281 Muscle weakness (generalized): Secondary | ICD-10-CM | POA: Insufficient documentation

## 2023-04-01 DIAGNOSIS — M25572 Pain in left ankle and joints of left foot: Secondary | ICD-10-CM | POA: Insufficient documentation

## 2023-04-21 ENCOUNTER — Other Ambulatory Visit: Payer: Self-pay

## 2023-04-21 MED ORDER — PREDNISONE 5 MG PO TABS
ORAL_TABLET | ORAL | 0 refills | Status: AC
  Filled 2023-04-21: qty 21, 6d supply, fill #0

## 2023-05-14 ENCOUNTER — Other Ambulatory Visit: Payer: Self-pay

## 2023-05-14 MED ORDER — PREDNISONE 5 MG PO TABS
ORAL_TABLET | ORAL | 0 refills | Status: DC
  Filled 2023-05-14: qty 21, 6d supply, fill #0

## 2023-06-11 NOTE — Telephone Encounter (Signed)
Note opened in error, appointment for this date cancelled.  Thresa Ross PT, DPT

## 2023-07-26 ENCOUNTER — Other Ambulatory Visit: Payer: Self-pay

## 2023-07-26 ENCOUNTER — Ambulatory Visit: Payer: 59 | Admitting: Nurse Practitioner

## 2023-07-26 VITALS — BP 110/76 | HR 98 | Temp 98.1°F | Resp 16 | Ht 61.0 in | Wt 183.6 lb

## 2023-07-26 DIAGNOSIS — F411 Generalized anxiety disorder: Secondary | ICD-10-CM

## 2023-07-26 DIAGNOSIS — F330 Major depressive disorder, recurrent, mild: Secondary | ICD-10-CM

## 2023-07-26 DIAGNOSIS — F845 Asperger's syndrome: Secondary | ICD-10-CM

## 2023-07-26 NOTE — Assessment & Plan Note (Signed)
Referral placed to psychiatry.

## 2023-07-26 NOTE — Assessment & Plan Note (Signed)
Referral placed to psychiatry. 

## 2023-07-26 NOTE — Progress Notes (Signed)
BP 110/76   Pulse 98   Temp 98.1 F (36.7 C) (Oral)   Resp 16   Ht 5\' 1"  (1.549 m)   Wt 183 lb 9.6 oz (83.3 kg)   SpO2 98%   BMI 34.69 kg/m    Subjective:    Patient ID: Stacy Joseph, female    DOB: 12-11-1990, 32 y.o.   MRN: [id number]  HPI: Stacy Joseph is a 32 y.o. female  Chief Complaint  Patient presents with   Annual Exam  -patient reports she does not want to do her physical today, she will reschedule.   Depression/anxiety/asperger's disorder: patient requesting referral to psychiatry. Patient reports she would be more comfortable having psychiatry care for her mental health. Currently on fluoxetine 40 mg daily.  Referral placed.      07/26/2023   11:05 AM 07/31/2022   10:20 AM 06/26/2022    9:15 AM 12/29/2021    9:19 AM 11/03/2021    9:25 AM  Depression screen PHQ 2/9  Decreased Interest 0 0 0 0 0  Down, Depressed, Hopeless 0 0 0 0 0  PHQ - 2 Score 0 0 0 0 0  Altered sleeping 0 0 1 0 0  Tired, decreased energy 0 1 1 0 0  Change in appetite 0 1 1 1  0  Feeling bad or failure about yourself  0 0 0 0 0  Trouble concentrating 0 0 0 0 0  Moving slowly or fidgety/restless 0 0 0 0 1  Suicidal thoughts 0 0 0 0 0  PHQ-9 Score 0 2 3 1 1   Difficult doing work/chores Not difficult at all Not difficult at all Somewhat difficult Not difficult at all Not difficult at all       07/26/2023   11:06 AM 07/31/2022   10:20 AM 06/26/2022    9:15 AM 12/29/2021    9:19 AM  GAD 7 : Generalized Anxiety Score  Nervous, Anxious, on Edge 0 0 1 0  Control/stop worrying 0 0 1 0  Worry too much - different things 0 0 1 0  Trouble relaxing 0 0 0 0  Restless 0 0 0 0  Easily annoyed or irritable 0 1 1 0  Afraid - awful might happen 0 0 0 0  Total GAD 7 Score 0 1 4 0  Anxiety Difficulty Not difficult at all Not difficult at all Somewhat difficult Not difficult at all     Relevant past medical, surgical, family and social history reviewed and updated as indicated. Interim  medical history since our last visit reviewed. Allergies and medications reviewed and updated.  Review of Systems  Constitutional: Negative for fever or weight change.  Respiratory: Negative for cough and shortness of breath.   Cardiovascular: Negative for chest pain or palpitations.  Gastrointestinal: Negative for abdominal pain, no bowel changes.  Musculoskeletal: Negative for gait problem or joint swelling.  Skin: Negative for rash.  Neurological: Negative for dizziness or headache.  No other specific complaints in a complete review of systems (except as listed in HPI above).      Objective:    BP 110/76   Pulse 98   Temp 98.1 F (36.7 C) (Oral)   Resp 16   Ht 5\' 1"  (1.549 m)   Wt 183 lb 9.6 oz (83.3 kg)   SpO2 98%   BMI 34.69 kg/m   Wt Readings from Last 3 Encounters:  07/26/23 183 lb 9.6 oz (83.3 kg)  07/31/22 186 lb 1.6 oz (84.4  kg)  06/26/22 185 lb 1.6 oz (84 kg)    Physical Exam  Constitutional: Patient appears well-developed and well-nourished. Obese  No distress.  HEENT: head atraumatic, normocephalic, pupils equal and reactive to light, neck supple Cardiovascular: Normal rate, regular rhythm and normal heart sounds.  No murmur heard. No BLE edema. Pulmonary/Chest: Effort normal and breath sounds normal. No respiratory distress. Abdominal: Soft.  There is no tenderness. Psychiatric: Patient has a normal mood and affect. behavior is normal. Judgment and thought content normal.     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

## 2023-08-30 NOTE — Progress Notes (Unsigned)
Name: Stacy Joseph   MRN: [id number]    DOB: 02-Apr-1991   Date:08/31/2023       Progress Note  Subjective  Chief Complaint  Chief Complaint  Patient presents with   Annual Exam    HPI  Patient presents for annual CPE.  Diet: Regular, she says she has been drinking protein shakes Exercise: 5 days a week  60 minutes  Last Eye Exam: 2022 Last Dental Exam: 2022  Flowsheet Row Office Visit from 08/31/2023 in [facility]  AUDIT-C Score 0      Depression: Phq 9 is  negative    08/31/2023    8:52 AM 07/26/2023   11:05 AM 07/31/2022   10:20 AM 06/26/2022    9:15 AM 12/29/2021    9:19 AM  Depression screen PHQ 2/9  Decreased Interest 0 0 0 0 0  Down, Depressed, Hopeless 0 0 0 0 0  PHQ - 2 Score 0 0 0 0 0  Altered sleeping 1 0 0 1 0  Tired, decreased energy 1 0 1 1 0  Change in appetite 0 0 1 1 1   Feeling bad or failure about yourself  0 0 0 0 0  Trouble concentrating 0 0 0 0 0  Moving slowly or fidgety/restless 0 0 0 0 0  Suicidal thoughts 0 0 0 0 0  PHQ-9 Score 2 0 2 3 1   Difficult doing work/chores Not difficult at all Not difficult at all Not difficult at all Somewhat difficult Not difficult at all   Hypertension: BP Readings from Last 3 Encounters:  08/31/23 122/78  07/26/23 110/76  07/31/22 120/78   Obesity: Wt Readings from Last 3 Encounters:  08/31/23 186 lb 12.8 oz (84.7 kg)  07/26/23 183 lb 9.6 oz (83.3 kg)  07/31/22 186 lb 1.6 oz (84.4 kg)   BMI Readings from Last 3 Encounters:  08/31/23 35.30 kg/m  07/26/23 34.69 kg/m  07/31/22 35.16 kg/m     Vaccines:   Tdap: 11/15/2017 COVID-19:02/04/20, 01/01/20 HPV: up to at age 41 , ask insurance if age between 66-45  Shingrix: 57-64 yo and ask insurance if covered when patient above 90 yo Pneumonia:  educated and discussed with patient. Flu:  educated and discussed with patient.    Hep C Screening: due STD testing and prevention (HIV/chl/gon/syphilis):  completed Intimate partner violence: negative screen  Sexual History : not sexually active Menstrual History/LMP/Abnormal Bleeding: Normal menstrual cycle Discussed importance of follow up if any post-menopausal bleeding: na  Incontinence Symptoms: negative for symptoms   Breast cancer:  - Last Mammogram: NA - BRCA gene screening: none  Osteoporosis Prevention : Discussed high calcium and vitamin D supplementation, weight bearing exercises Bone density :not applicable   Cervical cancer screening: is not sexually active,  unable to complete  Skin cancer: Discussed monitoring for atypical lesions  Colorectal cancer: NA   Lung cancer:  Low Dose CT Chest recommended if Age 28-80 years, 20 pack-year currently smoking OR have quit w/in 15years. Patient does not qualify for screen   ECG: none  Advanced Care Planning: A voluntary discussion about advance care planning including the explanation and discussion of advance directives.  Discussed health care proxy and Living will, and the patient was able to identify a health care proxy as Jacqualine Strohm.  Patient does not have a living will and power of attorney of health care   Lipids: Lab Results  Component Value Date   CHOL 146 11/16/2017   Lab Results  Component Value Date   HDL 35 (L) 11/16/2017   Lab Results  Component Value Date   LDLCALC 86 11/16/2017   Lab Results  Component Value Date   TRIG 152 (H) 11/16/2017   Lab Results  Component Value Date   CHOLHDL 4.2 11/16/2017   No results found for: "LDLDIRECT"  Glucose: Glucose, Bld  Date Value Ref Range Status  11/16/2017 96 65 - 99 mg/dL Final    Comment:    .            Fasting reference interval .     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) 09/29/2021   Seasonal allergic rhinitis 11/15/2017   Asperger's disorder 11/15/2017   Viral warts 11/15/2017   H/O  vitamin D deficiency 11/15/2017   Family history of diabetes mellitus 11/15/2017   Generalized anxiety disorder    Allergy     Past Surgical History:  Procedure Laterality Date   TONSILLECTOMY AND ADENOIDECTOMY      Family History  Problem Relation Age of Onset   Allergic rhinitis Mother    Diabetes Mother    Renal Disease Mother    Allergic rhinitis Father    Depression Father        Paternal side of family has strong history depression   ADD / ADHD Sister    Colon cancer Paternal Grandfather     Social History   Socioeconomic History   Marital status: Single    Spouse name: Not on file   Number of children: Not on file   Years of education: Not on file   Highest education level: Some college, no degree  Occupational History   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  Tobacco Use   Smoking status: Never   Smokeless tobacco: Never  Vaping Use   Vaping status: Never Used  Substance and Sexual Activity   Alcohol use: No   Drug use: No   Sexual activity: Not Currently    Comment: Never been sexually active  Other Topics Concern   Not on file  Social History Narrative   She lives works at [employer]   Social Determinants of Health   Financial Resource Strain: Low Risk  (08/31/2023)   Overall Financial Resource Strain (CARDIA)    Difficulty of Paying Living Expenses: Not hard at all  Food Insecurity: No Food Insecurity (08/31/2023)   Hunger Vital Sign    Worried About Running Out of Food in the Last Year: Never true    Ran Out of Food in the Last Year: Never true  Transportation Needs: No Transportation Needs (08/31/2023)   PRAPARE - Administrator, Civil Service (Medical): No    Lack of Transportation (Non-Medical): No  Physical Activity: Sufficiently Active (08/31/2023)   Exercise Vital Sign    Days of Exercise per Week: 5 days    Minutes of Exercise per Session: 30 min  Stress: No Stress Concern Present  (08/31/2023)   [employer] of Occupational Health - Occupational Stress Questionnaire    Feeling of Stress : Only a little  Recent Concern: Stress - Stress Concern Present (07/26/2023)   [employer] of Occupational Health - Occupational Stress Questionnaire    Feeling of Stress : To some extent  Social Connections: Moderately Isolated (08/31/2023)   Social Connection and Isolation Panel [NHANES]    Frequency of Communication with  Friends and Family: More than three times a week    Frequency of Social Gatherings with Friends and Family: More than three times a week    Attends Religious Services: 1 to 4 times per year    Active Member of [employer] or Organizations: No    Attends Banker Meetings: Never    Marital Status: Never married  Intimate Partner Violence: Not At Risk (08/31/2023)   Humiliation, Afraid, Rape, and Kick questionnaire    Fear of Current or Ex-Partner: No    Emotionally Abused: No    Physically Abused: No    Sexually Abused: No     Current Outpatient Medications:    diphenhydrAMINE (BENADRYL) 25 MG tablet, Take 25 mg by mouth every 6 (six) hours as needed., Disp: , Rfl:    FLUoxetine (PROZAC) 40 MG capsule, Take 1 capsule (40 mg total) by mouth daily., Disp: 90 capsule, Rfl: 0   loratadine (CLARITIN) 10 MG tablet, Take 10 mg by mouth daily., Disp: , Rfl:    Multiple Vitamins-Minerals (WOMENS DAILY FORM/FA/CA/FE) TABS, Take by mouth., Disp: , Rfl:   No Known Allergies   ROS  Constitutional: Negative for fever or weight change.  Respiratory: Negative for cough and shortness of breath.   Cardiovascular: Negative for chest pain or palpitations.  Gastrointestinal: Negative for abdominal pain, no bowel changes.  Musculoskeletal: Negative for gait problem or joint swelling.  Skin: Negative for rash.  Neurological: Negative for dizziness or headache.  No other specific complaints in a complete review of systems (except as listed in HPI above).    Objective  Vitals:   08/31/23 0850  BP: 122/78  Pulse: 97  Resp: 16  Temp: 97.8 F (36.6 C)  TempSrc: Oral  SpO2: 99%  Weight: 186 lb 12.8 oz (84.7 kg)  Height: 5\' 1"  (1.549 m)    Body mass index is 35.3 kg/m.  Physical Exam  Constitutional: Patient appears well-developed and well-nourished. No distress.  HENT: Head: Normocephalic and atraumatic. Ears: B TMs ok, no erythema or effusion; Nose: Nose normal. Mouth/Throat: Oropharynx is clear and moist. No oropharyngeal exudate.  Eyes: Conjunctivae and EOM are normal. Pupils are equal, round, and reactive to light. No scleral icterus.  Neck: Normal range of motion. Neck supple. No JVD present. No thyromegaly present.  Cardiovascular: Normal rate, regular rhythm and normal heart sounds.  No murmur heard. No BLE edema. Pulmonary/Chest: Effort normal and breath sounds normal. No respiratory distress. Abdominal: Soft. Bowel sounds are normal, no distension. There is no tenderness. no masses Breast: no lumps or masses, no nipple discharge or rashes Musculoskeletal: Normal range of motion, no joint effusions. No gross deformities Neurological: he is alert and oriented to person, place, and time. No cranial nerve deficit. Coordination, balance, strength, speech and gait are normal.  Skin: Skin is warm and dry. No rash noted. No erythema.  Psychiatric: Patient has a normal mood and affect. behavior is normal. Judgment and thought content normal.   No results found for this or any previous visit (from the past 2160 hour(s)).   Fall Risk:    08/31/2023    8:52 AM 07/26/2023   11:05 AM 07/31/2022   10:19 AM 06/26/2022    9:12 AM 12/29/2021    9:16 AM  Fall Risk   Falls in the past year? 0 0 0 0 0  Number falls in past yr: 0 0 0 0 0  Injury with Fall? 0 0 0 0 0  Risk for fall due to :  No Fall Risks No Fall Risks     Follow up Falls prevention discussed Falls prevention discussed Falls evaluation completed       Functional  Status Survey: Is the patient deaf or have difficulty hearing?: No Does the patient have difficulty seeing, even when wearing glasses/contacts?: No Does the patient have difficulty concentrating, remembering, or making decisions?: No Does the patient have difficulty walking or climbing stairs?: No Does the patient have difficulty dressing or bathing?: No Does the patient have difficulty doing errands alone such as visiting a doctor's office or shopping?: No   Assessment & Plan  1. Annual physical exam  - CBC with Differential/Platelet - COMPLETE METABOLIC PANEL WITH GFR - Hemoglobin A1c - Lipid panel - Hepatitis C antibody  2. Screening for diabetes mellitus  - COMPLETE METABOLIC PANEL WITH GFR - Hemoglobin A1c  3. Encounter for hepatitis C screening test for low risk patient  - Hepatitis C antibody  4. Screening for deficiency anemia  - CBC with Differential/Platelet  5. Screening for cholesterol level  - Lipid panel   -USPSTF grade A and B recommendations reviewed with patient; age-appropriate recommendations, preventive care, screening tests, etc discussed and encouraged; healthy living encouraged; see AVS for patient education given to patient -Discussed importance of 150 minutes of physical activity weekly, eat two servings of fish weekly, eat one serving of tree nuts ( cashews, pistachios, pecans, almonds.Marland Kitchen) every other day, eat 6 servings of fruit/vegetables daily and drink plenty of water and avoid sweet beverages.   -Reviewed Health Maintenance: Yes.

## 2023-08-31 ENCOUNTER — Ambulatory Visit (INDEPENDENT_AMBULATORY_CARE_PROVIDER_SITE_OTHER): Payer: 59 | Admitting: Nurse Practitioner

## 2023-08-31 ENCOUNTER — Encounter: Payer: Self-pay | Admitting: Nurse Practitioner

## 2023-08-31 ENCOUNTER — Other Ambulatory Visit: Payer: Self-pay

## 2023-08-31 VITALS — BP 122/78 | HR 97 | Temp 97.8°F | Resp 16 | Ht 61.0 in | Wt 186.8 lb

## 2023-08-31 DIAGNOSIS — Z131 Encounter for screening for diabetes mellitus: Secondary | ICD-10-CM

## 2023-08-31 DIAGNOSIS — Z0000 Encounter for general adult medical examination without abnormal findings: Secondary | ICD-10-CM

## 2023-08-31 DIAGNOSIS — Z13220 Encounter for screening for lipoid disorders: Secondary | ICD-10-CM | POA: Diagnosis not present

## 2023-08-31 DIAGNOSIS — Z1159 Encounter for screening for other viral diseases: Secondary | ICD-10-CM

## 2023-08-31 DIAGNOSIS — Z130 Encounter for screening for diseases of the blood and blood-forming organs and certain disorders involving the immune mechanism: Secondary | ICD-10-CM

## 2023-09-02 LAB — HEPATITIS C ANTIBODY: Hepatitis C Ab: NONREACTIVE

## 2023-09-02 LAB — CBC WITH DIFFERENTIAL/PLATELET
Absolute Lymphocytes: 2468 {cells}/uL (ref 850–3900)
Absolute Monocytes: 1061 {cells}/uL — ABNORMAL HIGH (ref 200–950)
Basophils Absolute: 42 {cells}/uL (ref 0–200)
Basophils Relative: 0.4 %
Eosinophils Absolute: 137 {cells}/uL (ref 15–500)
Eosinophils Relative: 1.3 %
HCT: 40 % (ref 35.0–45.0)
Hemoglobin: 13.5 g/dL (ref 11.7–15.5)
MCH: 30 pg (ref 27.0–33.0)
MCHC: 33.8 g/dL (ref 32.0–36.0)
MCV: 88.9 fL (ref 80.0–100.0)
MPV: 11.4 fL (ref 7.5–12.5)
Monocytes Relative: 10.1 %
Neutro Abs: 6794 {cells}/uL (ref 1500–7800)
Neutrophils Relative %: 64.7 %
Platelets: 338 10*3/uL (ref 140–400)
RBC: 4.5 10*6/uL (ref 3.80–5.10)
RDW: 12.2 % (ref 11.0–15.0)
Total Lymphocyte: 23.5 %
WBC: 10.5 10*3/uL (ref 3.8–10.8)

## 2023-09-02 LAB — COMPLETE METABOLIC PANEL WITH GFR
AG Ratio: 1.7 (calc) (ref 1.0–2.5)
ALT: 13 U/L (ref 6–29)
AST: 16 U/L (ref 10–30)
Albumin: 4.2 g/dL (ref 3.6–5.1)
Alkaline phosphatase (APISO): 79 U/L (ref 31–125)
BUN: 14 mg/dL (ref 7–25)
CO2: 23 mmol/L (ref 20–32)
Calcium: 9 mg/dL (ref 8.6–10.2)
Chloride: 106 mmol/L (ref 98–110)
Creat: 0.62 mg/dL (ref 0.50–0.97)
Globulin: 2.5 g/dL (ref 1.9–3.7)
Glucose, Bld: 89 mg/dL (ref 65–99)
Potassium: 4.2 mmol/L (ref 3.5–5.3)
Sodium: 136 mmol/L (ref 135–146)
Total Bilirubin: 0.3 mg/dL (ref 0.2–1.2)
Total Protein: 6.7 g/dL (ref 6.1–8.1)
eGFR: 121 mL/min/{1.73_m2} (ref >=60)

## 2023-09-02 LAB — LIPID PANEL
Cholesterol: 176 mg/dL (ref <200)
HDL: 35 mg/dL — ABNORMAL LOW (ref >=50)
LDL Cholesterol (Calc): 111 mg/dL — ABNORMAL HIGH
Non-HDL Cholesterol (Calc): 141 mg/dL — ABNORMAL HIGH (ref <130)
Total CHOL/HDL Ratio: 5 (calc) — ABNORMAL HIGH (ref <5.0)
Triglycerides: 181 mg/dL — ABNORMAL HIGH (ref <150)

## 2023-09-02 LAB — HEMOGLOBIN A1C
Hgb A1c MFr Bld: 5.5 %{Hb} (ref <5.7)
Mean Plasma Glucose: 111 mg/dL
eAG (mmol/L): 6.2 mmol/L

## 2023-09-16 ENCOUNTER — Other Ambulatory Visit: Payer: Self-pay

## 2023-09-16 ENCOUNTER — Encounter: Payer: Self-pay | Admitting: Emergency Medicine

## 2023-09-16 DIAGNOSIS — Z5321 Procedure and treatment not carried out due to patient leaving prior to being seen by health care provider: Secondary | ICD-10-CM | POA: Diagnosis not present

## 2023-09-16 DIAGNOSIS — R1031 Right lower quadrant pain: Secondary | ICD-10-CM | POA: Insufficient documentation

## 2023-09-16 NOTE — ED Triage Notes (Signed)
Patient ambulatory to triage with complaints of right lower quadrant abdominal pain that started approx 1800 tonight. Patient denies hx of kidney stones, denies chance of pregnancy. Patient is concerned for appendicitis. Denies vomiting. Endorses normal BM yesterday.

## 2023-09-17 ENCOUNTER — Emergency Department
Admission: EM | Admit: 2023-09-17 | Discharge: 2023-09-17 | Discharge status: Against Medical Advice | Payer: 59 | Attending: Emergency Medicine | Admitting: Emergency Medicine

## 2023-09-17 LAB — CBC
HCT: 38.4 % (ref 36.0–46.0)
Hemoglobin: 12.7 g/dL (ref 12.0–15.0)
MCH: 30.1 pg (ref 26.0–34.0)
MCHC: 33.1 g/dL (ref 30.0–36.0)
MCV: 91 fL (ref 80.0–100.0)
Platelets: 363 10*3/uL (ref 150–400)
RBC: 4.22 MIL/uL (ref 3.87–5.11)
RDW: 12.2 % (ref 11.5–15.5)
WBC: 12.5 10*3/uL — ABNORMAL HIGH (ref 4.0–10.5)
nRBC: 0 % (ref 0.0–0.2)

## 2023-09-17 LAB — COMPREHENSIVE METABOLIC PANEL
ALT: 19 U/L (ref 0–44)
AST: 20 U/L (ref 15–41)
Albumin: 4 g/dL (ref 3.5–5.0)
Alkaline Phosphatase: 89 U/L (ref 38–126)
Anion gap: 10 (ref 5–15)
BUN: 16 mg/dL (ref 6–20)
CO2: 25 mmol/L (ref 22–32)
Calcium: 9.1 mg/dL (ref 8.9–10.3)
Chloride: 103 mmol/L (ref 98–111)
Creatinine, Ser: 0.68 mg/dL (ref 0.44–1.00)
GFR, Estimated: >60 mL/min (ref >60)
Glucose, Bld: 89 mg/dL (ref 70–99)
Potassium: 3.7 mmol/L (ref 3.5–5.1)
Sodium: 138 mmol/L (ref 135–145)
Total Bilirubin: 0.3 mg/dL (ref <1.2)
Total Protein: 7.4 g/dL (ref 6.5–8.1)

## 2023-09-17 LAB — URINALYSIS, ROUTINE W REFLEX MICROSCOPIC
Bilirubin Urine: NEGATIVE
Glucose, UA: NEGATIVE mg/dL
Hgb urine dipstick: NEGATIVE
Ketones, ur: NEGATIVE mg/dL
Nitrite: NEGATIVE
Protein, ur: NEGATIVE mg/dL
Specific Gravity, Urine: 1.019 (ref 1.005–1.030)
pH: 5 (ref 5.0–8.0)

## 2023-09-17 LAB — POC URINE PREG, ED: Preg Test, Ur: NEGATIVE

## 2023-09-17 LAB — LIPASE, BLOOD: Lipase: 33 U/L (ref 11–51)

## 2023-10-27 ENCOUNTER — Ambulatory Visit: Payer: Commercial Managed Care - PPO | Admitting: Psychiatry

## 2024-09-01 ENCOUNTER — Encounter: Payer: Commercial Managed Care - PPO | Admitting: Nurse Practitioner

## 2024-09-01 NOTE — Progress Notes (Deleted)
 Name: Stacy Joseph   MRN: [id number]    DOB: 20-Feb-1991   Date:09/01/2024       Progress Note  Subjective  Chief Complaint  No chief complaint on file.   HPI  Patient presents for annual CPE. Discussed the use of AI scribe software for clinical note transcription with the patient, who gave verbal consent to proceed.  History of Present Illness     Diet: *** Exercise: ***  Sleep: *** Last dental exam:*** Last eye exam: ***  Flowsheet Row Office Visit from 08/31/2023 in [facility]  AUDIT-C Score 0   Depression: Phq 9 is  {Desc; negative/positive:13464}    08/31/2023    8:52 AM 07/26/2023   11:05 AM 07/31/2022   10:20 AM 06/26/2022    9:15 AM 12/29/2021    9:19 AM  Depression screen PHQ 2/9  Decreased Interest 0 0 0 0 0  Down, Depressed, Hopeless 0 0 0 0 0  PHQ - 2 Score 0 0 0 0 0  Altered sleeping 1 0 0 1 0  Tired, decreased energy 1 0 1 1 0  Change in appetite 0 0 1 1 1   Feeling bad or failure about yourself  0 0 0 0 0  Trouble concentrating 0 0 0 0 0  Moving slowly or fidgety/restless 0 0 0 0 0  Suicidal thoughts 0 0 0 0 0  PHQ-9 Score 2  0  2  3  1    Difficult doing work/chores Not difficult at all Not difficult at all Not difficult at all Somewhat difficult Not difficult at all     Data saved with a previous flowsheet row definition   Hypertension: BP Readings from Last 3 Encounters:  09/16/23 (!) 133/91  08/31/23 122/78  07/26/23 110/76   Obesity: Wt Readings from Last 3 Encounters:  09/16/23 185 lb (83.9 kg)  08/31/23 186 lb 12.8 oz (84.7 kg)  07/26/23 183 lb 9.6 oz (83.3 kg)   BMI Readings from Last 3 Encounters:  09/16/23 34.96 kg/m  08/31/23 35.30 kg/m  07/26/23 34.69 kg/m     Vaccines:  HPV: up to at age 62 , ask insurance if age between 59-45  Shingrix: 23-64 yo and ask insurance if covered when patient above 56 yo Pneumonia:  educated and discussed with patient. Flu:  educated and discussed with  patient.  Hep C Screening: completed STD testing and prevention (HIV/chl/gon/syphilis): completed Intimate partner violence:*** Sexual History : Menstrual History/LMP/Abnormal Bleeding: LMP: Incontinence Symptoms:   Breast cancer:  - Last Mammogram: does not qualify - BRCA gene screening: none  Osteoporosis: Discussed high calcium and vitamin D  supplementation, weight bearing exercises  Cervical cancer screening: have tried previously but have been unable to complete pap,  will try doing a self swab and see if we can get enough cells.   Skin cancer: Discussed monitoring for atypical lesions  Colorectal cancer: does not qualfiy   Lung cancer:   Low Dose CT Chest recommended if Age 33-80 years, 20 pack-year currently smoking OR have quit w/in 15years. Patient does not qualify.   ECG: none  Advanced Care Planning: A voluntary discussion about advance care planning including the explanation and discussion of advance directives.  Discussed health care proxy and Living will, and the patient was able to identify a health care proxy as Stacy Joseph .  Patient does not have a living will at present time. If patient does have living will, I have requested they bring this to the  clinic to be scanned in to their chart.  Lipids: Lab Results  Component Value Date   CHOL 176 08/31/2023   CHOL 146 11/16/2017   Lab Results  Component Value Date   HDL 35 (L) 08/31/2023   HDL 35 (L) 11/16/2017   Lab Results  Component Value Date   LDLCALC 111 (H) 08/31/2023   LDLCALC 86 11/16/2017   Lab Results  Component Value Date   TRIG 181 (H) 08/31/2023   TRIG 152 (H) 11/16/2017   Lab Results  Component Value Date   CHOLHDL 5.0 (H) 08/31/2023   CHOLHDL 4.2 11/16/2017   No results found for: LDLDIRECT  Glucose: Glucose, Bld  Date Value Ref Range Status  09/16/2023 89 70 - 99 mg/dL Final    Comment:    Glucose reference range applies only to samples taken after fasting for at least  8 hours.  08/31/2023 89 65 - 99 mg/dL Final    Comment:    .            Fasting reference interval .   11/16/2017 96 65 - 99 mg/dL Final    Comment:    .            Fasting reference interval .     Patient Active Problem List   Diagnosis Date Noted   BMI 32.0-32.9,adult 09/29/2021   Mild episode of recurrent depressive disorder 09/29/2021   Seasonal allergic rhinitis 11/15/2017   Asperger's disorder 11/15/2017   Viral warts 11/15/2017   H/O vitamin D  deficiency 11/15/2017   Family history of diabetes mellitus 11/15/2017   Generalized anxiety disorder    Allergy     Past Surgical History:  Procedure Laterality Date   TONSILLECTOMY AND ADENOIDECTOMY      Family History  Problem Relation Age of Onset   Allergic rhinitis Mother    Diabetes Mother    Renal Disease Mother    Allergic rhinitis Father    Depression Father        Paternal side of family has strong history depression   ADD / ADHD Sister    Colon cancer Paternal Grandfather     Social History   Socioeconomic History   Marital status: Single    Spouse name: Not on file   Number of children: Not on file   Years of education: Not on file   Highest education level: Some college, no degree  Occupational History   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  Tobacco Use   Smoking status: Never   Smokeless tobacco: Never  Vaping Use   Vaping status: Never Used  Substance and Sexual Activity   Alcohol use: No   Drug use: No   Sexual activity: Not Currently    Comment: Never been sexually active  Other Topics Concern   Not on file  Social History Narrative   She lives works at [employer]   Social Drivers of Health   Financial Resource Strain: Low Risk  (08/31/2023)   Overall Financial Resource Strain (CARDIA)    Difficulty of Paying Living Expenses: Not hard at all  Food Insecurity: No Food Insecurity (08/31/2023)   Hunger Vital Sign    Worried About Running  Out of Food in the Last Year: Never true    Ran Out of Food in the Last Year: Never true  Transportation Needs: No Transportation Needs (08/31/2023)   PRAPARE - Administrator, Civil Service (Medical): No  Lack of Transportation (Non-Medical): No  Physical Activity: Sufficiently Active (08/31/2023)   Exercise Vital Sign    Days of Exercise per Week: 5 days    Minutes of Exercise per Session: 30 min  Stress: No Stress Concern Present (08/31/2023)   [employer] of Occupational Health - Occupational Stress Questionnaire    Feeling of Stress : Only a little  Recent Concern: Stress - Stress Concern Present (07/26/2023)   [employer] of Occupational Health - Occupational Stress Questionnaire    Feeling of Stress : To some extent  Social Connections: Moderately Isolated (08/31/2023)   Social Connection and Isolation Panel    Frequency of Communication with Friends and Family: More than three times a week    Frequency of Social Gatherings with Friends and Family: More than three times a week    Attends Religious Services: 1 to 4 times per year    Active Member of [employer] or Organizations: No    Attends Banker Meetings: Never    Marital Status: Never married  Intimate Partner Violence: Not At Risk (08/31/2023)   Humiliation, Afraid, Rape, and Kick questionnaire    Fear of Current or Ex-Partner: No    Emotionally Abused: No    Physically Abused: No    Sexually Abused: No     Current Outpatient Medications:    diphenhydrAMINE (BENADRYL) 25 MG tablet, Take 25 mg by mouth every 6 (six) hours as needed., Disp: , Rfl:    FLUoxetine  (PROZAC ) 40 MG capsule, Take 1 capsule (40 mg total) by mouth daily., Disp: 90 capsule, Rfl: 0   loratadine (CLARITIN) 10 MG tablet, Take 10 mg by mouth daily., Disp: , Rfl:    Multiple Vitamins-Minerals (WOMENS DAILY FORM/FA/CA/FE) TABS, Take by mouth., Disp: , Rfl:   No Known Allergies   ROS  Constitutional: Negative for  fever or weight change.  Respiratory: Negative for cough and shortness of breath.   Cardiovascular: Negative for chest pain or palpitations.  Gastrointestinal: Negative for abdominal pain, no bowel changes.  Musculoskeletal: Negative for gait problem or joint swelling.  Skin: Negative for rash.  Neurological: Negative for dizziness or headache.  No other specific complaints in a complete review of systems (except as listed in HPI above).   Objective  There were no vitals filed for this visit.  There is no height or weight on file to calculate BMI.  Physical Exam Vitals reviewed.  Constitutional:      Appearance: Normal appearance.  HENT:     Head: Normocephalic.     Right Ear: Tympanic membrane normal.     Left Ear: Tympanic membrane normal.     Nose: Nose normal.  Eyes:     Extraocular Movements: Extraocular movements intact.     Conjunctiva/sclera: Conjunctivae normal.     Pupils: Pupils are equal, round, and reactive to light.  Neck:     Thyroid: No thyroid mass, thyromegaly or thyroid tenderness.  Cardiovascular:     Rate and Rhythm: Normal rate and regular rhythm.     Pulses: Normal pulses.     Heart sounds: Normal heart sounds.  Pulmonary:     Effort: Pulmonary effort is normal.     Breath sounds: Normal breath sounds.  Abdominal:     General: Bowel sounds are normal.     Palpations: Abdomen is soft.  Musculoskeletal:        General: Normal range of motion.     Cervical back: Normal range of motion and neck supple.  Right lower leg: No edema.     Left lower leg: No edema.  Skin:    General: Skin is warm and dry.     Capillary Refill: Capillary refill takes less than 2 seconds.  Neurological:     General: No focal deficit present.     Mental Status: She is alert and oriented to person, place, and time. Mental status is at baseline.  Psychiatric:        Mood and Affect: Mood normal.        Behavior: Behavior normal.        Thought Content: Thought content  normal.        Judgment: Judgment normal.        Fall Risk:    08/31/2023    8:52 AM 07/26/2023   11:05 AM 07/31/2022   10:19 AM 06/26/2022    9:12 AM 12/29/2021    9:16 AM  Fall Risk   Falls in the past year? 0 0 0 0 0  Number falls in past yr: 0 0 0 0 0  Injury with Fall? 0 0 0 0 0  Risk for fall due to : No Fall Risks No Fall Risks     Follow up Falls prevention discussed Falls prevention discussed Falls evaluation completed        Data saved with a previous flowsheet row definition     Functional Status Survey:     Assessment & Plan  Problem List Items Addressed This Visit   None  Assessment and Plan Assessment & Plan     -USPSTF grade A and B recommendations reviewed with patient; age-appropriate recommendations, preventive care, screening tests, etc discussed and encouraged; healthy living encouraged; see AVS for patient education given to patient -Discussed importance of 150 minutes of physical activity weekly, eat two servings of fish weekly, eat one serving of tree nuts ( cashews, pistachios, pecans, almonds.SABRA) every other day, eat 6 servings of fruit/vegetables daily and drink plenty of water and avoid sweet beverages.   -Reviewed Health Maintenance: yes
